# Patient Record
Sex: Female | Born: 1980 | Hispanic: Yes | Smoking: Current some day smoker
Health system: Southern US, Community
[De-identification: ages and names within clinical notes are randomized; demographics above are authoritative.]

---

## 2009-02-09 ENCOUNTER — Ambulatory Visit: Payer: Self-pay | Admitting: Family Medicine

## 2011-06-14 ENCOUNTER — Ambulatory Visit: Payer: Self-pay

## 2011-07-11 ENCOUNTER — Ambulatory Visit: Payer: Self-pay | Admitting: Internal Medicine

## 2013-05-23 ENCOUNTER — Emergency Department: Payer: Self-pay | Admitting: Emergency Medicine

## 2014-09-18 ENCOUNTER — Ambulatory Visit (INDEPENDENT_AMBULATORY_CARE_PROVIDER_SITE_OTHER): Payer: Managed Care, Other (non HMO) | Admitting: Podiatry

## 2014-09-18 ENCOUNTER — Encounter: Payer: Self-pay | Admitting: Podiatry

## 2014-09-18 VITALS — BP 106/73 | HR 73 | Resp 16 | Ht 63.0 in | Wt 180.0 lb

## 2014-09-18 DIAGNOSIS — L03012 Cellulitis of left finger: Secondary | ICD-10-CM

## 2014-09-18 DIAGNOSIS — L03039 Cellulitis of unspecified toe: Secondary | ICD-10-CM

## 2014-09-18 MED ORDER — CEPHALEXIN 500 MG PO CAPS: 500.0000 mg | ORAL_CAPSULE | Freq: Three times a day (TID) | ORAL | Status: AC

## 2014-09-18 NOTE — Patient Instructions (Signed)

## 2014-09-18 NOTE — Progress Notes (Signed)
   Subjective:    Patient ID: Jennifer Carney, female    DOB: 1981-01-12, 34 y.o.   MRN: [id number]  HPI Comments: 34 year old female presents the office today with complaints of an infected left fourth toenail. She states that his been ongoing since Saturday his been progressive. She states that wearing shoes hurt the toe. She has been soaking the foot in Epson salts and using peroxide. She's been taking ibuprofen and Advil for the pain. She denies any streaking however the toe has become red and swollen. She has noticed some common out from the corners of the toenail. She states this is previously happen on this toenail before the toenails been taken off previously. No other complaints at this time.     Review of Systems  All other systems reviewed and are negative.      Objective:   Physical Exam AAO x3, NAD DP/PT pulses palpable bilaterally, CRT less than 3 seconds Protective sensation intact with Simms Weinstein monofilament, vibratory sensation intact, Achilles tendon reflex intact On the medial aspect the left fourth digit there is purulence expressed from the corners of the toenail. There is evidence of incurvation along the medial nail border. There is erythema overlying the distal aspect of the left digit with edema. There is no ascending cellulitis. No areas of fluctuance or crepitus. The remaining nails without pathology. No open lesions or pre-ulcer lesions identified. No pain with calf compression, swelling, warmth, erythema. No areas of pinpoint bony tenderness or pain the vibratory sensation bilaterally. No overlying edema, erythema, increase in warmth to bilateral lower extremities.        Assessment & Plan:  34 year old female left medial fourth digit paronychia/ingrown toenail -Treatment options were discussed the patient including alternatives, risks, complications. -At this time recommended a partial nail avulsion of the left fourth digit nail. Risks and complications  were discussed the patient for which she understands and verbally consents to the procedure. Under sterile conditions a total of 3 mL of a one-to-one mixture of 2% lidocaine plain and 0.5% Marcaine plain was infiltrated in a digital block fashion. Once anesthetized, the skin was prepped in sterile fashion. Tourniquet was then applied. Next the medial border of the left hallux nail was then sharply excised making sure to remove the entire offending nail border. There is found to be purulence expressed during the procedure. Once the nail was removed the area was debrided and no further purulence was expressed. The area was then irrigated. Betadine ointment was applied followed by a dry sterile dressing. After application of the dressing the tourniquet was removed and there was found to be an immediate capillary refill time to the digit. The patient tolerated the procedure well without any complications. Post procedure instructions were discussed the patient for which she verbally understood. Prescribed Keflex. Monitor for any clinical signs or symptoms of worsening infection and directed to call the office immediately should any occur or go directly to the emergency room. We discussed possible chemical matricectomy the future to help prevent this from recurring and she would like to proceed with this once the infection clears.

## 2014-09-25 ENCOUNTER — Ambulatory Visit (INDEPENDENT_AMBULATORY_CARE_PROVIDER_SITE_OTHER): Payer: Managed Care, Other (non HMO) | Admitting: Podiatry

## 2014-09-25 VITALS — BP 102/67 | HR 100 | Resp 16

## 2014-09-25 DIAGNOSIS — L03012 Cellulitis of left finger: Secondary | ICD-10-CM

## 2014-09-25 NOTE — Patient Instructions (Signed)

## 2014-09-28 NOTE — Progress Notes (Signed)
Patient ID: Jennifer Carney, female   DOB: [birth year], 34 y.o.   MRN: [id number]  Subjective: 34 year old female returns the office 1 week status post left medial fourth digit partial nail avulsion secondary to paronychia. She states that she has been continuing to soak the foot twice a day Epson salts covering with antibiotic ointment and a Band-Aid. She has been taking the antibiotics. She states that periodically the toe is well and wearing certain shoes however she denies any drainage or purulence. She states that there is been no significant redness overlying the area and denies any streaking. There is currently no tenderness to palpation overlying the site. No other complaints at this time. Denies any systemic complaints as fevers, chills, nausea, vomiting.  Objective: AAO 3, NAD DP/PT pulses palpable bilaterally, CRT less than 3 seconds Protective sensation appears to be intact with Dorann OuSimms Weinstein monofilament, vibratory sensation intact. Left fourth digit status post partial nail avulsion of the medial aspect which is healing well at this time. There is a small amount of hyperkeratotic tissue overlying the area. There is no tenderness to palpation. There is trace edema. No surrounding erythema, increase in warmth, ascending cellulitis, drainage/purulence. No other areas of tenderness to bilateral lower extremity is. No overlying edema, erythema, increase in warmth. No pain with calf compression, swelling, warmth, erythema.  Assessment: 34 year old female 1 week status post left medial fourth digit partial nail avulsion secondary to paronychia, resolving.  Plan: -Treatment options were discussed with the patient including alternatives, risks, complications. -Recommended her to continue soaking in Epson salt soaks twice a day followed by antibiotic ointment and a Band-Aid. Can leave the area uncovered at night. Continue this until the area has completely healed. Months for a signs or symptoms of  infection and directed to call the office immediately should any occur or go to the ER.  -She was inquiring about using phenol to help prevent that corner from reoccurring as this is the third infection she's had a that toenail this year. Discussed with her that once she finishes this course of antibiotics and the areas healed and repeat procedure. She will call the office next couple weeks to schedule this.  -Follow-up as needed. In the meantime, course call the office with any questions, concerns, change in symptoms.

## 2015-02-05 ENCOUNTER — Ambulatory Visit: Payer: Managed Care, Other (non HMO) | Attending: Physician Assistant | Admitting: Physical Therapy

## 2015-02-05 ENCOUNTER — Encounter: Payer: Self-pay | Admitting: Physical Therapy

## 2015-02-05 DIAGNOSIS — M542 Cervicalgia: Secondary | ICD-10-CM | POA: Insufficient documentation

## 2015-02-05 DIAGNOSIS — M25511 Pain in right shoulder: Secondary | ICD-10-CM | POA: Diagnosis not present

## 2015-02-06 NOTE — Therapy (Signed)
[place] [facility] [address] [place], [address] Phone: [phone]   Fax:  [phone]  Physical Therapy Evaluation  Patient Details  Name: Jennifer Carney MRN: [id number] Date of Birth: 1981-03-21 Referring Provider:  Geoffery Lyons, MD  Encounter Date: 02/05/2015      PT End of Session - 02/05/15 2000    Visit Number 1   Number of Visits 12   Date for PT Re-Evaluation 03/20/15   PT Start Time 1835   PT Stop Time 1920   PT Time Calculation (min) 45 min   Activity Tolerance Patient tolerated treatment well   Behavior During Therapy [facility] for tasks assessed/performed      History reviewed. No pertinent past medical history.  History reviewed. No pertinent past surgical history.  There were no vitals filed for this visit.  Visit Diagnosis:  Right shoulder pain - Plan: PT plan of care cert/re-cert  Cervicalgia - Plan: PT plan of care cert/re-cert      Subjective Assessment - 02/05/15 1905    Subjective Patient reports sh is having pain in right shoulder that limits her ability to perform dialiy tasks without difficulty.   Patient Stated Goals to get rid of pain and be able to use right UE again normally without pain/difficulty   Currently in Pain? Yes   Pain Score 4    Pain Location Shoulder   Pain Orientation Right   Pain Descriptors / Indicators Aching   Pain Type Chronic pain   Pain Onset More than a month ago   Aggravating Factors  reaching back out to side, lifting, reaching, lying on right side for sleeping   Pain Relieving Factors rest   Effect of Pain on Daily Activities unable to use right UE without pain   Multiple Pain Sites No            [facility] PT Assessment - 02/05/15 2356    Assessment   Medical Diagnosis right shoulder pain/cervicalgia   Onset Date/Surgical Date 06/05/14   Hand Dominance Right   Next MD Visit unknown   Precautions   Precautions None   Balance Screen   Has the  patient fallen in the past 6 months No   Has the patient had a decrease in activity level because of a fear of falling?  No   Is the patient reluctant to leave their home because of a fear of falling?  No   Prior Function   Level of Independence Independent      Objective: AROM: cervical spine: rotation right and left: 55 degrees without reproduction of symptoms, lateral flexion right 40 degrees with pain right upper trapezius, left 50 degrees, flexion 70 degrees, extension 45 degrees without pain AROM: right shoulder limited forward elevation, abduction ER and IR with pain limiting motions Spurlings negative for reproduction of symptoms, distraction and compression cervical spine negative for reproduction of symptoms.  Strength: grossly WFL's both UE's with pain reproduced with right shoulder ER and elevation/abduction Impingement tests + for reproduction of symptoms right shoulder Observation: posture: + winging of right scapula, + hiking right shoulder  Treatment: Highvolt estim. Right shoulder/upper trapezius to decrease pain and muscle spasms: continuous mode, (4) electrodes applied to right shoulder girdle with patient seated with ice pack x 20 min. Applied to same  Patient response to treatment: decreased spasms 50% and pain to 2-3/10 allowing patient to put shirt back on with much less difficulty and pain.  PT Education - 02/05/15 1900    Education provided Yes   Education Details educated in positions to avoid pain right shoulder: no reaching out or crossing midline, or back   Person(s) Educated Patient   Methods Explanation;Demonstration   Comprehension Verbalized understanding             PT Long Term Goals - 02/05/15 2010    PT LONG TERM GOAL #1   Title Patient will improve quickDash to 45% or better demonstrating imrpoved UE function with decreased pain by 02/26/2015   Baseline 59% score   Status New   PT LONG TERM GOAL #2   Title Patient will be able  to reach back and overhead with right UE with minimal pain to improe personal care and work related activities  by 02/26/2015   Baseline unable to reach back without increased pain limiting movement   Status New   PT LONG TERM GOAL #3   Title QuickDAsh score 30% or less by 03/20/2015 indicating improved self perceived disablity with functional use right UE    Baseline 59% score   Status New   PT LONG TERM GOAL #4   Title Patient will be independent with home program for self management of pain and exercises by 03/20/2015   Baseline patient has limited knowledge of appropriate pain control/progressive exercises for right shoulder pain   Status New            Plan - 02/05/15 2000    Clinical Impression Statement Patient is a right hand dominant female who presents with painful right shoulder with limitations in ROM and strength and limited knowledge of appropriate pain control strategies and progression of exercises to return to prior level of function. Her quickDash scores are 59% and 50% (work module) impairment. She has + spasms in upper trapezius and decreased scapualr control and strength to stabilize right shoulder and will require physical therapy intervention to address pain and weakness/motor control in order to return to previous level of function with personal care and work related activties involving right UE.    Pt will benefit from skilled therapeutic intervention in order to improve on the following deficits Increased muscle spasms;Decreased range of motion;Pain;Impaired UE functional use   Rehab Potential Good   PT Frequency 2x / week   PT Duration 6 weeks   PT Treatment/Interventions Manual techniques;Cryotherapy;Electrical Stimulation;Therapeutic exercise;Moist Heat;Ultrasound;Patient/family education   PT Next Visit Plan pain control, ther. ex, manual  therapy techniques    PT Home Exercise Plan scapular control, posture correction for right shoulder/cervical spine    Consulted and Agree with Plan of Care Patient         Problem List There are no active problems to display for this patient.   Beacher MayBrooks, Mcguire Gasparyan PT 02/06/2015, 3:36 PM  [place] [facility] [address] [place], [place], [address] Phone: [phone]   Fax:  [phone]

## 2015-02-09 ENCOUNTER — Ambulatory Visit: Payer: Managed Care, Other (non HMO) | Admitting: Physical Therapy

## 2015-02-09 ENCOUNTER — Encounter: Payer: Self-pay | Admitting: Physical Therapy

## 2015-02-09 DIAGNOSIS — M25511 Pain in right shoulder: Secondary | ICD-10-CM | POA: Diagnosis not present

## 2015-02-10 NOTE — Therapy (Signed)
[place] [facility] [address] [place], [place], [address] Phone: [phone]   Fax:  [phone]  Physical Therapy Treatment  Patient Details  Name: Jennifer Carney MRN: [id number] Date of Birth: 03/29/1981 Referring Provider:  Geoffery Lyonsurner, Eric M, MD  Encounter Date: 02/09/2015      PT End of Session - 02/09/15 2040    Visit Number 2   Number of Visits 12   Date for PT Re-Evaluation 03/20/15   PT Start Time 1945   PT Stop Time 2030   PT Time Calculation (min) 45 min   Activity Tolerance Patient tolerated treatment well   Behavior During Therapy [facility] for tasks assessed/performed      History reviewed. No pertinent past medical history.  History reviewed. No pertinent past surgical history.  There were no vitals filed for this visit.  Visit Diagnosis:  Right shoulder pain      Subjective Assessment - 02/09/15 1947    Subjective Patient reports sh is having pain in right shoulder that limits her ability to perform dialiy tasks without difficulty. she felt much better following last session and is able to move arm more with less difficulty and pain.    Patient Stated Goals to get rid of pain and be able to use right UE again normally without pain/difficulty   Currently in Pain? 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 Applied (4) electrodes to right shoulder girdle for upper trapezius, lateral aspect of shoulder and along medial border of scapula with patient seated and intensity to tolerance     Exercises: instructed in isometric FF, ER and IR right shoulder at wall with verbal cues following demonstration, Re assessed home program for scapular adduction with lower trapezius activation    Patient response to treatment: reported decrease symptoms into right shoulder, less difficulty with reaching up overhead following session and improve control and performance of exercises for strengthening following demonstration and with verbal cuing.         PT Education - 02/09/15 2030    Education provided Yes   Education Details instructed in exercises for right shoulder to begins strengthening and re educating scapular stabilizers   Person(s) Educated Patient   Methods Explanation;Demonstration;Verbal cues   Comprehension Verbalized understanding;Returned demonstration;Verbal cues required             PT Long Term Goals - 02/05/15 2010    PT LONG TERM GOAL #1   Title Patient will improve quickDash to 45% or better demonstrating imrpoved UE function with decreased pain by 02/26/2015   Baseline  59% score   Status New   PT LONG TERM GOAL #2   Title Patient will be able to reach back and overhead with right UE with minimal pain to improe personal care and work related activities  by 02/26/2015   Baseline unable to reach back without increased pain limiting movement   Status New   PT LONG TERM GOAL #3   Title QuickDAsh score 30% or less by 03/20/2015 indicating improved self perceived disablity with functional use right UE    Baseline 59% score   Status New   PT LONG TERM GOAL #4   Title Patient  will be independent with home program for self management of pain and exercises by 03/20/2015   Baseline patient has limited knowledge of appropriate pain control/progressive exercises for right shoulder pain   Status New               Plan - 02/09/15 2044    Clinical Impression Statement Patient demonstrates decreased pain and improved ability to move right UE since previous session. She continues with limitations of pain, limited strength and ROM which limit her ability to perform ADL's and work related activitie requiring repetitive use of right shoulder as shoulder level. She will benefit from continued physical therapy interventi to address limitaitons and progress towards goals.    Pt will benefit from skilled therapeutic intervention in order to improve on the following deficits Increased muscle spasms;Decreased range of motion;Pain;Impaired UE functional use   Rehab Potential Good   PT Frequency 2x / week   PT Duration 6 weeks   PT Treatment/Interventions Manual techniques;Cryotherapy;Electrical Stimulation;Therapeutic exercise;Moist Heat;Ultrasound;Patient/family education   PT Next Visit Plan pain control, ther. ex, manual  therapy techniques    PT Home Exercise Plan scapular control, posture correction for right shoulder/cervical spine        Problem List There are no active problems to display for this patient.   Beacher May PT 02/10/2015, 4:19 PM  [place] [facility] [address] [place], [address] Phone: [phone]   Fax:  [phone]

## 2015-02-11 ENCOUNTER — Ambulatory Visit: Payer: Managed Care, Other (non HMO) | Admitting: Physical Therapy

## 2015-02-17 ENCOUNTER — Encounter: Payer: Self-pay | Admitting: Physical Therapy

## 2015-02-17 ENCOUNTER — Ambulatory Visit: Payer: Managed Care, Other (non HMO) | Admitting: Physical Therapy

## 2015-02-17 DIAGNOSIS — M542 Cervicalgia: Secondary | ICD-10-CM

## 2015-02-17 DIAGNOSIS — M25511 Pain in right shoulder: Secondary | ICD-10-CM

## 2015-02-17 NOTE — Therapy (Signed)
[place] [facility] [address] [place], [address] Phone: [phone]   Fax:  [phone]  Physical Therapy Treatment  Patient Details  Name: Jennifer Carney MRN: [id number] Date of Birth: 1981-05-01 Referring Provider:  Geoffery Lyons, MD  Encounter Date: 02/17/2015      PT End of Session - 02/17/15 1833    Visit Number 3   Number of Visits 12   Date for PT Re-Evaluation 03/20/15   PT Start Time 1830   PT Stop Time 1905   PT Time Calculation (min) 35 min   Activity Tolerance Patient tolerated treatment well   Behavior During Therapy [facility] for tasks assessed/performed      History reviewed. No pertinent past medical history.  History reviewed. No pertinent past surgical history.  There were no vitals filed for this visit.  Visit Diagnosis:  Right shoulder pain  Cervicalgia      Subjective Assessment - 02/17/15 1832    Subjective Patient reports she is having pain in right shoulder that limits her ability to perform daily tasks without difficulty. She continues to have increased difficulty with work related tasks involving right UE.    Patient Stated Goals to get rid of pain and be able to use right UE again normally without pain/difficulty   Currently in Pain? Yes   Pain Score 4    Pain Location Shoulder   Pain Orientation Right   Pain Descriptors / Indicators Aching   Pain Type Chronic pain   Pain Onset More than a month ago   Multiple Pain Sites No       Objective: Right shoulder AROM: forward elevation WFL's with pain end range, ER and IR WFL's with pain Palpation: + tenderness along upper trapezius with spasms and pain into upper arm        [facility] Adult PT Treatment/Exercise - 02/17/15 0001    Modalities   Modalities Electrical Stimulation;Cryotherapy   Cryotherapy   Number Minutes Cryotherapy 20 Minutes   Cryotherapy Location Shoulder   Type of Cryotherapy Ice pack   Electrical  Stimulation   Electrical Stimulation Location right shoulder    Electrical Stimulation Parameters high volt (2) electrodes applied to anterior and upper trapezius muscle, Russian stim. with electrodes placed along medial aspect of scapula 10/10 cycle    Electrical Stimulation Goals Pain;Neuromuscular facilitation   Manual Therapy   Manual Therapy Soft tissue mobilization   Manual therapy comments with patient sitting in chair    Soft tissue mobilization right shoulder upper trapezius and anterior aspect of shoulder into upper arm     Patient response to treatment:  Decreased pain in right shoulder with improved ability to raise arm overhead with less pain and difficulty, no exercise performed due to increased pain level with movement            PT Education - 02/17/15 1850    Education provided Yes   Education Details Instructed to use ice every hour as needed to control pain and spasms in right shoulder   Person(s) Educated Patient   Methods Explanation   Comprehension Verbalized understanding             PT Long Term Goals - 02/05/15 2010    PT LONG TERM GOAL #1   Title Patient will improve quickDash to 45% or better demonstrating imrpoved UE function with decreased pain by 02/26/2015   Baseline 59% score   Status New   PT LONG TERM GOAL #2  Title Patient will be able to reach back and overhead with right UE with minimal pain to improe personal care and work related activities  by 02/26/2015   Baseline unable to reach back without increased pain limiting movement   Status New   PT LONG TERM GOAL #3   Title QuickDAsh score 30% or less by 03/20/2015 indicating improved self perceived disablity with functional use right UE    Baseline 59% score   Status New   PT LONG TERM GOAL #4   Title Patient will be independent with home program for self management of pain and exercises by 03/20/2015   Baseline patient has limited knowledge of appropriate pain control/progressive  exercises for right shoulder pain   Status New               Plan - 02/17/15 1833    Clinical Impression Statement Patient reported decreased pain in right shoulder with treatment. She conitnues with pain as primary limiting factor to being able to use right UE without difficulty for work and [place] tasks.    Pt will benefit from skilled therapeutic intervention in order to improve on the following deficits Increased muscle spasms;Decreased range of motion;Pain;Impaired UE functional use   Rehab Potential Good   PT Frequency 2x / week   PT Duration 6 weeks   PT Treatment/Interventions Manual techniques;Cryotherapy;Electrical Stimulation;Therapeutic exercise;Moist Heat;Ultrasound;Patient/family education   PT Next Visit Plan pain control, ther. ex, manual  therapy techniques    PT Home Exercise Plan scapular control, posture correction for right shoulder/cervical spine        Problem List There are no active problems to display for this patient.   Beacher May PT 02/17/2015, 11:20 PM  [place] [facility] SP[facility]TS [facility] [address], [place], [address] Phone: [phone]   Fax:  [phone]

## 2015-02-19 ENCOUNTER — Ambulatory Visit: Payer: Managed Care, Other (non HMO) | Admitting: Physical Therapy

## 2015-02-19 ENCOUNTER — Encounter: Payer: Self-pay | Admitting: Physical Therapy

## 2015-02-19 DIAGNOSIS — M25511 Pain in right shoulder: Secondary | ICD-10-CM | POA: Diagnosis not present

## 2015-02-19 DIAGNOSIS — M542 Cervicalgia: Secondary | ICD-10-CM

## 2015-02-20 NOTE — Therapy (Signed)
[facility]INE [address] [place], [address] Phone: [phone]   Fax:  [phone]  Physical Therapy Treatment  Patient Details  Name: Jennifer Carney MRN: [id number] Date of Birth: [birth year] Referring Provider:  Geoffery Lyons, MD  Encounter Date: 02/19/2015      PT End of Session - 02/19/15 1930    Visit Number 4   Number of Visits 12   Date for PT Re-Evaluation 03/20/15   PT Start Time 1830   PT Stop Time 1915   PT Time Calculation (min) 45 min   Activity Tolerance Patient tolerated treatment well   Behavior During Therapy [facility] for tasks assessed/performed      History reviewed. No pertinent past medical history.  History reviewed. No pertinent past surgical history.  There were no vitals filed for this visit.  Visit Diagnosis:  Right shoulder pain  Cervicalgia      Subjective Assessment - 02/19/15 1840    Subjective Paitent reports her right shoulder is still sore. She is having headache today.   Patient Stated Goals to get rid of pain and be able to use right UE again normally without pain/difficulty   Currently in Pain? Yes   Pain Score 4    Pain Location Shoulder   Pain Orientation Right   Pain Descriptors / Indicators Aching   Pain Type Chronic pain   Pain Onset More than a month ago   Multiple Pain Sites No          [facility] Adult PT Treatment/Exercise - 02/19/15 1842    Modalities   Modalities Electrical Stimulation;Cryotherapy   Cryotherapy   Cryotherapy Location Shoulder right: ice pack   Electrical Stimulation   Electrical Stimulation Location/parameters right shoulder: high volt estim. Applied to latera aspect of shoulder and upper trapezius, continuous mode with intensity to tolerance, [place] stim. Applied along medial border of right scapula and lower trapezius muscle with intensity to tolerance/contraction 10/10 cycle with patient seated in chair   Electrical Stimulation  Goals Pain;Neuromuscular facilitation   Ultrasound   location Right shoulder/upper trapezius into cervical spine paraspinal muscles   parameters pulsed @ 50% 1.4w/cm2 x 10 min. With patient seated followed by electrical stimulation          Patient response to treatment: decreased spasms and pain and decreased tenderness to palpation following US/estim. Patient was able to raise right UE out to side and overhead with less stiffness and difficulty following treatment, she verbalized understanding of home exercises to perform tomorrow when in less pain.          PT Education - 02/19/15 1915    Education provided Yes   Education Details Reassessed home exercises to continue for ROM and scapular control   Person(s) Educated Patient   Methods Explanation   Comprehension Verbalized understanding             PT Long Term Goals - 02/05/15 2010    PT LONG TERM GOAL #1   Title Patient will improve quickDash to 45% or better demonstrating imrpoved UE function with decreased pain by 02/26/2015   Baseline 59% score   Status New   PT LONG TERM GOAL #2   Title Patient will be able to reach back and overhead with right UE with minimal pain to improe personal care and work related activities  by 02/26/2015   Baseline unable to reach back without increased pain limiting movement   Status New   PT  LONG TERM GOAL #3   Title QuickDAsh score 30% or less by 03/20/2015 indicating improved self perceived disablity with functional use right UE    Baseline 59% score   Status New   PT LONG TERM GOAL #4   Title Patient will be independent with home program for self management of pain and exercises by 03/20/2015   Baseline patient has limited knowledge of appropriate pain control/progressive exercises for right shoulder pain   Status New               Plan - 02/19/15 1930    Clinical Impression Statement Patient demonstrates improving ROM and decreased pain in right shoulder. She  continues with headache which she is concerned about and will follow up with MD regarding this (history of migraines).    Pt will benefit from skilled therapeutic intervention in order to improve on the following deficits Increased muscle spasms;Decreased range of motion;Pain;Impaired UE functional use   Rehab Potential Good   PT Frequency 2x / week   PT Duration 6 weeks   PT Treatment/Interventions Manual techniques;Cryotherapy;Electrical Stimulation;Therapeutic exercise;Moist Heat;Ultrasound;Patient/family education   PT Next Visit Plan pain control, ther. ex, manual  therapy techniques    PT Home Exercise Plan scapular control, posture correction for right shoulder/cervical spine        Problem List There are no active problems to display for this patient.   Beacher May PT 02/20/2015, 12:17 PM  [facility] PHYSICAL AND SPORTS MEDICINE [address], [place], [address] Phone: [phone]   Fax:  [phone]

## 2015-02-24 ENCOUNTER — Encounter: Payer: Self-pay | Admitting: Physical Therapy

## 2015-02-24 ENCOUNTER — Ambulatory Visit: Payer: Managed Care, Other (non HMO) | Admitting: Physical Therapy

## 2015-02-24 DIAGNOSIS — M25511 Pain in right shoulder: Secondary | ICD-10-CM | POA: Diagnosis not present

## 2015-02-24 NOTE — Therapy (Signed)
[facility] PHYSICAL [facility]ORTS MEDICINE [address], [place], [address] Phone: [phone]   Fax:  [phone]  Physical Therapy Treatment  Patient Details  Name: Jennifer Carney MRN: [id number] Date of Birth: [birth year] Referring Provider:  Geoffery Lyons, MD  Encounter Date: 02/24/2015      PT End of Session - 02/24/15 1950    Visit Number 5   Number of Visits 12   Date for PT Re-Evaluation 03/20/15   PT Start Time 1832   PT Stop Time 1925   PT Time Calculation (min) 53 min   Activity Tolerance Patient tolerated treatment well   Behavior During Therapy [facility] for tasks assessed/performed      History reviewed. No pertinent past medical history.  History reviewed. No pertinent past surgical history.  There were no vitals filed for this visit.  Visit Diagnosis:  Right shoulder pain      Subjective Assessment - 02/24/15 1833    Subjective Paitent reports her right shoulder is still sore. Overall much better and is working decals at work and this seems to take the pressure off of her neck and shoulder.    Patient Stated Goals to get rid of pain and be able to use right UE again normally without pain/difficulty   Currently in Pain? Yes   Pain Score 3    Pain Location Shoulder   Pain Orientation Right   Pain Descriptors / Indicators Aching   Pain Type Chronic pain   Pain Onset More than a month ago   Multiple Pain Sites No          [facility] Adult PT Treatment/Exercise - 02/24/15 2028    Exercises   Exercises Other Exercises   Other Exercises  at [employer] scapular rows 10# double, 5# single arm rows x 10 each with verbal cuing and tactile cues to prevent hiking of shoulder and keeping shoulder in good alignment, seated reverse chin up 15# x 10 reps, standing straight arm pull downs 15# with verbal cues for good alignment and not to hike shoulder   Modalities   Modalities Electrical Stimulation;Cryotherapy;Ultrasound   Cryotherapy    Number Minutes Cryotherapy 20 Minutes   Cryotherapy Location Shoulder   Electrical Stimulation   Electrical Stimulation Location right shoulder    Electrical Stimulation Parameters high volt (2) electrodes applied to anterior and uppper trapezius muscle, Russian stim. sith electrodes placed along medial aspect of scpaula 10/10 cycle   Electrical Stimulation Goals Pain;Neuromuscular facilitation   Ultrasound   Ultrasound Location Right shoulder/upper trapezius muscle with patient seated   Ultrasound Parameters pulsed 50% @ 1.4 w/cm2    Ultrasound Goals Pain   Patient response to treatment: improved control with decreased shoulder hiking and more erect posture with decreased rounded right shoulder, required verbal cuing and demonstration to perform exercises correctly, decreased spasms and pain to mild reported at end of session with pain control modalities           PT Education - 02/24/15 1900    Education provided Yes   Education Details instructed in exercises for scapular control and correct alignment of shoulder during exercises to avoid increased shoulder pain   Person(s) Educated Patient   Methods Explanation;Demonstration;Verbal cues   Comprehension Verbalized understanding;Returned demonstration;Verbal cues required             PT Long Term Goals - 02/05/15 2010    PT LONG TERM GOAL #1   Title Patient will improve quickDash to 45%  or better demonstrating imrpoved UE function with decreased pain by 02/26/2015   Baseline 59% score   Status New   PT LONG TERM GOAL #2   Title Patient will be able to reach back and overhead with right UE with minimal pain to improe personal care and work related activities  by 02/26/2015   Baseline unable to reach back without increased pain limiting movement   Status New   PT LONG TERM GOAL #3   Title QuickDAsh score 30% or less by 03/20/2015 indicating improved self perceived disablity with functional use right UE    Baseline 59%  score   Status New   PT LONG TERM GOAL #4   Title Patient will be independent with home program for self management of pain and exercises by 03/20/2015   Baseline patient has limited knowledge of appropriate pain control/progressive exercises for right shoulder pain   Status New               Plan - 02/24/15 2034    Clinical Impression Statement Patient is progressing well with decreased pain and progressing with scapular control exercises. She requires verbal cuing and guidance for correct technique during exercises and is responding well to pain control modalities.    Pt will benefit from skilled therapeutic intervention in order to improve on the following deficits Increased muscle spasms;Decreased range of motion;Pain;Impaired UE functional use   Rehab Potential Good   PT Frequency 2x / week   PT Duration 6 weeks   PT Treatment/Interventions Manual techniques;Cryotherapy;Electrical Stimulation;Therapeutic exercise;Moist Heat;Ultrasound;Patient/family education   PT Next Visit Plan pain control, ther. ex, manual  therapy techniques         Problem List There are no active problems to display for this patient.   Beacher May PT 02/24/2015, 8:37 PM  [facility] PHYSICAL AND SPORTS MEDICINE [address] [place], [address] Phone: [phone]   Fax:  [phone]

## 2015-02-26 ENCOUNTER — Ambulatory Visit: Payer: Managed Care, Other (non HMO) | Admitting: Physical Therapy

## 2015-02-26 ENCOUNTER — Encounter: Payer: Self-pay | Admitting: Physical Therapy

## 2015-02-26 DIAGNOSIS — M542 Cervicalgia: Secondary | ICD-10-CM

## 2015-02-26 DIAGNOSIS — M25511 Pain in right shoulder: Secondary | ICD-10-CM | POA: Diagnosis not present

## 2015-02-26 NOTE — Therapy (Signed)
[facility] PHYSICAL [facility] SPORTS MEDICINE [address], [place], [address] Phone: [phone]   Fax:  [phone]  Physical Therapy Treatment  Patient Details  Name: Jennifer Carney MRN: [id number] Date of Birth: [birth year] Referring Provider:  Geoffery Lyons, MD  Encounter Date: 02/26/2015      PT End of Session - 02/26/15 1908    Visit Number 6   Number of Visits 12   Date for PT Re-Evaluation 03/20/15   PT Start Time 1835   PT Stop Time 1920   PT Time Calculation (min) 45 min   Activity Tolerance Patient tolerated treatment well   Behavior During Therapy [facility] for tasks assessed/performed      History reviewed. No pertinent past medical history.  History reviewed. No pertinent past surgical history.  There were no vitals filed for this visit.  Visit Diagnosis:  Right shoulder pain  Cervicalgia      Subjective Assessment - 02/26/15 1837    Subjective Paitent reports her right shoulder is still sore. Overall much better and does not have the intensity of headache that she did when she began physical therapy. She feels the [place] has helped with this.    Currently in Pain? Yes   Pain Score 2    Pain Location Shoulder   Pain Orientation Right   Pain Descriptors / Indicators Aching   Pain Type Chronic pain   Multiple Pain Sites No           [facility] Adult PT Treatment/Exercise - 02/26/15 1839               Modalities   Modalities Electrical Stimulation;Cryotherapy;Ultrasound   Cryotherapy   Number Minutes Cryotherapy 25 Minutes   Cryotherapy Location Shoulder, right   Type of Cryotherapy Ice pack   Electrical Stimulation   Electrical Stimulation Location right shoulder    Electrical Stimulation Parameters high volt (2) electrodes applied to upper trapezius and lateral aspect of right shoulder, Russian stim. applied alond medial apsec tof right scpula and lower tapezius muscles 10/10 cycle with patient seated   Electrical  Stimulation Goals Pain;Neuromuscular facilitation   Ultrasound   Ultrasound Location shoulder, right   Ultrasound Parameters pulse 50% @ 1.4 w/cm2    Ultrasound Goals Pain      Patient response to treatment: decreased spasms and tenderness to palpation along right upper trapezius and shoulder, able to raise right arm above shoulder with minimal difficulty and pain          PT Education - 02/26/15 1907    Education Details Re assessed home program for strengthening patient is to perform over the weekend prior to returning next week   Person(s) Educated Patient   Methods Explanation   Comprehension Verbalized understanding             PT Long Term Goals - 02/05/15 2010    PT LONG TERM GOAL #1   Title Patient will improve quickDash to 45% or better demonstrating imrpoved UE function with decreased pain by 02/26/2015   Baseline 59% score   Status New   PT LONG TERM GOAL #2   Title Patient will be able to reach back and overhead with right UE with minimal pain to improe personal care and work related activities  by 02/26/2015   Baseline unable to reach back without increased pain limiting movement   Status New   PT LONG TERM GOAL #3   Title QuickDAsh score 30% or less by 03/20/2015  indicating improved self perceived disablity with functional use right UE    Baseline 59% score   Status New   PT LONG TERM GOAL #4   Title Patient will be independent with home program for self management of pain and exercises by 03/20/2015   Baseline patient has limited knowledge of appropriate pain control/progressive exercises for right shoulder pain   Status New               Plan - 02/26/15 1908    Clinical Impression Statement Patient is progressing well with decreased pain in right shoulder and able to perform household chores and personal care with less pain and difficulty. She continues with pain with raising arm above shoulder level.    Rehab Potential Good   PT Frequency  2x / week   PT Duration 6 weeks   PT Next Visit Plan pain control, ther. ex, manual  therapy techniques    PT Home Exercise Plan scapular control, posture correction for right shoulder/cervical spine        Problem List There are no active problems to display for this patient.   Beacher May PT 02/26/2015, 10:05 PM  [facility] [facility] PHYSICAL AND SPORTS MEDICINE [address] [place], [address] Phone: [phone]   Fax:  [phone]

## 2015-03-02 ENCOUNTER — Encounter: Payer: Managed Care, Other (non HMO) | Admitting: Physical Therapy

## 2015-03-03 ENCOUNTER — Encounter: Payer: Self-pay | Admitting: Physical Therapy

## 2015-03-03 ENCOUNTER — Ambulatory Visit: Payer: Managed Care, Other (non HMO) | Admitting: Physical Therapy

## 2015-03-03 DIAGNOSIS — M25511 Pain in right shoulder: Secondary | ICD-10-CM

## 2015-03-03 DIAGNOSIS — M542 Cervicalgia: Secondary | ICD-10-CM

## 2015-03-03 NOTE — Therapy (Signed)
[facility] [address] [place], [place], [address] Phone: [phone]   Fax:  [phone]  Physical Therapy Treatment  Patient Details  Name: Jennifer Carney MRN: [id number] Date of Birth: April 28, 1981 Referring Provider:  Geoffery Lyonsurner, Eric M, MD  Encounter Date: 03/03/2015      PT End of Session - 03/03/15 1920    Visit Number 7   Number of Visits 12   Date for PT Re-Evaluation 03/20/15   PT Start Time 1833   PT Stop Time 1925   PT Time Calculation (min) 52 min   Activity Tolerance Patient tolerated treatment well   Behavior During Therapy [facility] for tasks assessed/performed      History reviewed. No pertinent past medical history.  History reviewed. No pertinent past surgical history.  There were no vitals filed for this visit.  Visit Diagnosis:  Right shoulder pain  Cervicalgia      Subjective Assessment - 03/03/15 1834    Subjective Patient reports her shoulder is improving and she is stiff and sore in her neck today. 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 She feels therapy is helping with pain and improved ability to move right shoulder   Currently in Pain? Yes   Pain Score 2    Pain Location Neck   Pain Orientation Right   Pain Descriptors / Indicators Aching   Pain Type Chronic pain   Pain Onset More than a month ago   Multiple Pain Sites No      Palpation: moderate spasms with tenderness palpable along right upper trapezius into right cervical spine paraspinal muscles       [facility] Adult PT Treatment/Exercise - 03/03/15 1917    Exercises   Exercises Other Exercises   Other Exercises  at [employer] scapular rows 10# double x 10  with verbal cuing and tactile cues to prevent hiking of shoulder and keeping shoulder in good alignment, seated reverse chin up 15# x 10 reps, standing straight arm pull downs 15# with verbal cues for good alignment and not to hike shoulder,  chest press with 10# x 10 reps with assistance to monitor weight intensity   Modalities   Modalities Electrical Stimulation;Cryotherapy;Ultrasound   Cryotherapy   Number Minutes Cryotherapy 20 Minutes   Cryotherapy Location Shoulder   Type of Cryotherapy Ice pack   Electrical Stimulation   Electrical Stimulation Location right shoulder    Electrical Stimulation Parameters high volt (2) electrodes applied to lateral aspect of shoulder and upper trapezius/lower cspine, [place]ussian stim. with electrodes placed along medial aspect of scapula 10/10 cycle   Electrical Stimulation Goals Pain;Neuromuscular facilitation   Ultrasound   Ultrasound Location rigth shoulder   Ultrasound Parameters 1MHz pulsed 50% @ 1.4 w/cm2 upper trpaezius and shoulder region x 10 min.   Ultrasound Goals Pain      Patient response to treatment: significant decrease in spasm in right upper trapezius muscle and along cervical spine with mild to no spasms at end of treatment, improved strength noted with exercises with mild discomfort in right shoulder with chest press and shoulder rows, required assistance and guidance with verbal cues for correct alignment/posture during treatment/exercise          PT Education - 03/03/15 1917    Education provided Yes             PT Long Term Goals - 02/05/15 2010    PT LONG TERM GOAL #1  Title Patient will improve quickDash to 45% or better demonstrating imrpoved UE function with decreased pain by 02/26/2015   Baseline 59% score   Status New   PT LONG TERM GOAL #2   Title Patient will be able to reach back and overhead with right UE with minimal pain to improe personal care and work related activities  by 02/26/2015   Baseline unable to reach back without increased pain limiting movement   Status New   PT LONG TERM GOAL #3   Title QuickDAsh score 30% or less by 03/20/2015 indicating improved self perceived disablity with functional use right UE    Baseline 59% score    Status New   PT LONG TERM GOAL #4   Title Patient will be independent with home program for self management of pain and exercises by 03/20/2015   Baseline patient has limited knowledge of appropriate pain control/progressive exercises for right shoulder pain   Status New               Plan - 03/03/15 1930    Clinical Impression Statement Patient is improving with decreasing right shoulder pain and progressing with exercises. She continues with pain in right sdie of cervical spine with spasms that requrie additional physical therapy intervention to achieve return to prior level of funciton.    Pt will benefit from skilled therapeutic intervention in order to improve on the following deficits Increased muscle spasms;Decreased range of motion;Pain;Impaired UE functional use   Rehab Potential Good   PT Frequency 2x / week   PT Duration 6 weeks   PT Treatment/Interventions Manual techniques;Cryotherapy;Electrical Stimulation;Therapeutic exercise;Moist Heat;Ultrasound;Patient/family education        Problem List There are no active problems to display for this patient.   Beacher May PT 03/03/2015, 10:51 PM  [place] [facility] PHYSICAL AND SPORTS MEDICINE [address], [place], [address] Phone: [phone]   Fax:  [phone]

## 2015-03-05 ENCOUNTER — Ambulatory Visit: Payer: Managed Care, Other (non HMO) | Admitting: Physical Therapy

## 2015-03-05 ENCOUNTER — Encounter: Payer: Self-pay | Admitting: Physical Therapy

## 2015-03-05 DIAGNOSIS — M542 Cervicalgia: Secondary | ICD-10-CM

## 2015-03-05 DIAGNOSIS — M25511 Pain in right shoulder: Secondary | ICD-10-CM | POA: Diagnosis not present

## 2015-03-05 NOTE — Therapy (Signed)
[facility] [facility] PH[facility] AND [facility] MEDICINE [address] [place], [place], [address] Phone: [phone]   Fax:  [phone]  Physical Therapy Treatment  Patient Details  Name: Jennifer Carney MRN: [id number] Date of Birth: September 16, 1980 Referring Provider:  Geoffery Lyonsurner, Eric M, [place]  Encounter Date: 03/05/2015      PT End of Session - 03/05/15 1950    Visit Number 8   Number of Visits 12   Date for PT Re-Evaluation 03/20/15   PT Start Time 1845   PT Stop Time 1925   PT Time Calculation (min) 40 min   Activity Tolerance Patient tolerated treatment well   Behavior During Therapy [facility] for tasks assessed/performed      History reviewed. No pertinent past medical history.  History reviewed. No pertinent past surgical history.  There were no vitals filed for this visit.  Visit Diagnosis:  Right shoulder pain  Cervicalgia      Subjective Assessment - 03/05/15 1930    Subjective Patient reports her shoulder is improving and she is stiff and sore in her neck today. 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 She is able to move right arm better with less pain and difficiulty.    Patient Stated Goals to get rid of pain and be able to use right UE again normally without pain/difficulty   Currently in Pain? Yes   Pain Score 2    Pain Location Neck   Pain Orientation Right   Pain Descriptors / Indicators Aching   Pain Type Chronic pain   Pain Onset More than a month ago   Multiple Pain Sites No      Objective: AROM cervical spine: lateral flexion left 40, right 45/50 degree, right shoulder WNL's without reports of increased pain   Palpation: + tenderness and spasms along right upper trapezius and cervical spine paraspinal muscles       [facility] Adult PT Treatment/Exercise - 03/05/15 1930               Modalities   Modalities Electrical Stimulation;Cryotherapy;Ultrasound   Moist Heat Therapy   Number Minutes Moist  Heat 20 Minutes   Moist Heat Location Shoulder   Cryotherapy   Cryotherapy Location --   Electrical Stimulation   Electrical Stimulation Location right shoulder    Electrical Stimulation Parameters high volt (2) electrodes applied to anterior and upper trapezius muscles, Russian stim.. with electrodes placed along medial aspect of scapula 10/10 cycle   Electrical Stimulation Goals Pain;Neuromuscular facilitation   Ultrasound   Ultrasound Location right shoulder   Ultrasound Parameters 1MHz pulsed 50% @ 1.4 w/cm2 x 10 min.   Ultrasound Goals Pain      Patient response to treatment: patient reported decreased pain in right shoulder with improved soft tissue elasticity in upper trapezius muscle and lateral cervical spine muscles, continues with decreased lateral flexion to left          PT Education - 03/05/15 1940    Education provided Yes   Education Details Reinforced home program for strengthening exercises   Person(s) Educated Patient   Methods Explanation   Comprehension Verbalized understanding             PT Long Term Goals - 02/05/15 2010    PT LONG TERM GOAL #1   Title Patient will improve quickDash to 45% or better demonstrating imrpoved UE function with decreased pain by 02/26/2015   Baseline 59% score   Status  New   PT LONG TERM GOAL #2   Title Patient will be able to reach back and overhead with right UE with minimal pain to improe personal care and work related activities  by 02/26/2015   Baseline unable to reach back without increased pain limiting movement   Status New   PT LONG TERM GOAL #3   Title QuickDAsh score 30% or less by 03/20/2015 indicating improved self perceived disablity with functional use right UE    Baseline 59% score   Status New   PT LONG TERM GOAL #4   Title Patient will be independent with home program for self management of pain and exercises by 03/20/2015   Baseline patient has limited knowledge of appropriate pain  control/progressive exercises for right shoulder pain   Status New               Plan - 03/05/15 2000    Clinical Impression Statement Patient continues to progress with goals with decreased right shoulder pain and decreased headaches. She continues with limited lateral flexion of cervical spine to left side with pulling on right. She will benefit from continued physical therapy intervention to address limitations and achieve goals.   Pt will benefit from skilled therapeutic intervention in order to improve on the following deficits Increased muscle spasms;Decreased range of motion;Pain;Impaired UE functional use   Rehab Potential Good   PT Frequency 2x / week   PT Duration 6 weeks   PT Treatment/Interventions Manual techniques;Cryotherapy;Electrical Stimulation;Therapeutic exercise;Moist Heat;Ultrasound;Patient/family education   PT Next Visit Plan pain control, ther. ex, manual  therapy techniques         Problem List There are no active problems to display for this patient.   Beacher May PT 03/05/2015, 10:32 PM  [place] [facility] PHYSICAL AND SPORTS MEDICINE [address], [place], [address] Phone: [phone]   Fax:  [phone]

## 2015-03-10 ENCOUNTER — Encounter: Payer: Self-pay | Admitting: Physical Therapy

## 2015-03-10 ENCOUNTER — Ambulatory Visit: Payer: Managed Care, Other (non HMO) | Attending: Physician Assistant | Admitting: Physical Therapy

## 2015-03-10 DIAGNOSIS — M6281 Muscle weakness (generalized): Secondary | ICD-10-CM | POA: Diagnosis present

## 2015-03-10 DIAGNOSIS — M25511 Pain in right shoulder: Secondary | ICD-10-CM | POA: Diagnosis present

## 2015-03-10 DIAGNOSIS — M542 Cervicalgia: Secondary | ICD-10-CM | POA: Diagnosis present

## 2015-03-11 NOTE — Therapy (Signed)
[place] [facility] [address] [place], [place], [address] Phone: [phone]   Fax:  [phone]  Physical Therapy Treatment  Patient Details  Name: Jennifer Carney MRN: [id number] Date of Birth: 12-05-1980 Referring Provider:  Geoffery Lyonsurner, Eric M, [place]  Encounter Date: 03/10/2015      PT End of Session - 03/10/15 1920    Visit Number 9   Number of Visits 12   Date for PT Re-Evaluation 03/20/15   PT Start Time 1800   PT Stop Time 1900   PT Time Calculation (min) 60 min   Activity Tolerance Patient tolerated treatment well   Behavior During Therapy [facility] for tasks assessed/performed      History reviewed. No pertinent past medical history.  History reviewed. No pertinent past surgical history.  There were no vitals filed for this visit.  Visit Diagnosis:  Right shoulder pain  Cervicalgia      Subjective Assessment - 03/10/15 1803    Subjective Patient reports she is improving and has less pain in right shoulder this week due to having off from work. She is having soreness in shoulder in general and still cannot use it like she used to without pain and difficulty   Patient Stated Goals to get rid of pain and be able to use right UE again normally without pain/difficulty   Currently in Pain? Yes   Pain Score 2    Pain Orientation Right   Pain Onset More than a month ago   Pain Frequency Intermittent   Multiple Pain Sites No           [facility] Adult PT Treatment/Exercise - 03/10/15 2250    Exercises   Exercises  Other Exercises   Other Exercises   with [facility] at [facility]: scapular rows with 10# x 15 reps, with verbal and tactile cues to prevent hiking of right shoulder, seated reverse chin up with 15# 2 x 10 reps, standing straight arm pull downs with 15# x 15 reps    Modalities   Modalities Electrical stimulation;Cryotherapy;Ultrasound   Cryotherapy   Ice pack Location  Shoulder, right   Electrical  Stimulation   Electrical Stimulation Location/parameters Right shoulder: high volt estim. Continuous for muscle spams (2) electrodes applied to anterior and upper trapezius, Guern[place]ian stime 10/10 cycle applied along right medial border of scapula with intensity to contraction   Electrical Stimulation Goals Pain;neuromuscular facilitation   Ultrasound   Ultrasound parameters/ Goals  1 MHz pulsed @ 50% 1.4 w/cm2 x 10 min. To right shoulder/upper trapezius muscle with patient sitting in chair Pain      Patient response to treatment: improved controlled motion of right shoulder during exercises following instruction and verbal cues for correct alignment of right shoulder and with repetition, decreased pain reported in right shoulder with US and estim., mild to no pain at end of session          PT Education - 03/10/15 1845    Education provided Yes   Education Details Reinforced home exercises for improving strength right shoulder/scapular control   Person(s) Educated Patient   Methods Explanation   Comprehension Verbalized understanding             PT Long Term Goals - 02/05/15 2010    PT LONG TERM GOAL #1   Title Patient will improve quickDash to 45% or better demonstrating imrpoved UE function with decreased pain by 02/26/2015   Baseline 59% score   Status New  PT LONG TERM GOAL #2   Title Patient will be able to reach back and overhead with right UE with minimal pain to improe personal care and work related activities  by 02/26/2015   Baseline unable to reach back without increased pain limiting movement   Status New   PT LONG TERM GOAL #3   Title QuickDAsh score 30% or less by 03/20/2015 indicating improved self perceived disablity with functional use right UE    Baseline 59% score   Status New   PT LONG TERM GOAL #4   Title Patient will be independent with home program for self management of pain and exercises by 03/20/2015   Baseline patient has limited knowledge of  appropriate pain control/progressive exercises for right shoulder pain   Status New               Plan - 03/10/15 1848    Clinical Impression Statement Patient is progressing well towards goals with decreased shoulder pain and improving functional use with work related activities and with dressing and personal care tasks. She continues with weakness and pain that limit full function and will continue to benefit from additional physical therapy intervention to achieve return to prior level of function.    Pt will benefit from skilled therapeutic intervention in order to improve on the following deficits Increased muscle spasms;Decreased range of motion;Pain;Impaired UE functional use   Rehab Potential Good   PT Frequency 2x / week   PT Duration 6 weeks   PT Treatment/Interventions Manual techniques;Cryotherapy;Electrical Stimulation;Therapeutic exercise;Moist Heat;Ultrasound;Patient/family education   PT Next Visit Plan pain control, ther. ex, manual  therapy techniques         Problem List There are no active problems to display for this patient.   Beacher May PT 03/11/2015, 7:00 PM  [facility] [address], [place], [address] Phone: [phone]   Fax:  [phone]

## 2015-03-12 ENCOUNTER — Encounter: Payer: Self-pay | Admitting: Physical Therapy

## 2015-03-12 ENCOUNTER — Ambulatory Visit: Payer: Managed Care, Other (non HMO) | Admitting: Physical Therapy

## 2015-03-12 DIAGNOSIS — M542 Cervicalgia: Secondary | ICD-10-CM

## 2015-03-12 DIAGNOSIS — M25511 Pain in right shoulder: Secondary | ICD-10-CM

## 2015-03-12 NOTE — Therapy (Signed)
[place] [facility] [address] [place], [place], [address] Phone: [phone]   Fax:  [phone]  Physical Therapy Treatment  Patient Details  Name: Jennifer Carney MRN: [id number] Date of Birth: 28-Jan-1981 Referring Provider:  Geoffery Lyonsurner, Eric M, [place]  Encounter Date: 03/12/2015      PT End of Session - 03/12/15 0905    Visit Number 10   Number of Visits 12   Date for PT Re-Evaluation 03/20/15   PT Start Time 0808   PT Stop Time 0900   PT Time Calculation (min) 52 min   Activity Tolerance Patient tolerated treatment well   Behavior During Therapy [facility] for tasks assessed/performed      History reviewed. No pertinent past medical history.  History reviewed. No pertinent past surgical history.  There were no vitals filed for this visit.  Visit Diagnosis:  Right shoulder pain  Cervicalgia      Subjective Assessment - 03/12/15 0849    Subjective Patient reports she is improving and has less pain in right shoulder this week due to having off from work. She is returning to work Advertising account executivetomorrow. Main complaint is right sided pain and spasms lower cervical spine/upper trapezius.   Patient Stated Goals to get rid of pain and be able to use right UE again normally without pain/difficulty   Currently in Pain? Yes   Pain Score 1    Pain Location Shoulder   Pain Orientation Right   Pain Descriptors / Indicators Aching;Spasm   Pain Type Chronic pain   Pain Onset More than a month ago   Pain Frequency Intermittent      Objective: Palpation: right side upper trapezius with spasm lower cervical spine region, right cervical spine paraspinal muscles AROM: right shoulder forward elevation WNL's with increased discomfort at end range       [facility] - 03/12/15 0851    Exercises   Exercises Other Exercises   Other Exercises  instructed in additional exercises: thoracic mobilization/stretch with towel, standing  scapular rows and shoulder extension to hips x 10 rpes each with verbal cues and demonstration, side lying ER with 4# weight, supine hook lying with 4# weight bilateral forward flexion overhead, instructed in self stretching of upper trapezius. Single arm row in standing with 4# weight with verbal cuing x 10 reps each.   Modalities   Modalities Electrical Stimulation;Cryotherapy;Ultrasound   Moist Heat Therapy   Moist Heat Location --   Cryotherapy   Number Minutes Cryotherapy 20 Minutes   Cryotherapy Location Shoulder   Type of Cryotherapy Ice pack   Electrical Stimulation   Electrical Stimulation Location right shoulder    Electrical Stimulation Parameters high volt (2) electrodes applied to right shoulder/upper trapezius muscle, russian stim with electrodes place along medial aspect of scapula 10/10 cycle   Electrical Stimulation Goals Pain;Neuromuscular facilitation   Ultrasound   Ultrasound Location right shoulder   Ultrasound Parameters 3MHz static pulsed 20% 0.8 w/cm2 x 3 min. over trigger point right upper trapezius    Ultrasound Goals Pain   Manual Therapy   Manual Therapy Soft tissue mobilization;Myofascial release   Soft tissue mobilization upper trapezius, cervical spine,   Myofascial Release upper trapezius release 2 x 60 seconds, sub occipital release with patient supine lying      Patient response to treatment: improved flexibility in right upper trapezius and ROM cervical spine with decreased stiffness and tension reported, good technique with exercises following instruction, demonstration and with verbal  cuing          PT Education - 03/12/15 0857    Education provided Yes   Education Details home exercises: thoracic self mobilizaiton/stretch, upper trapezius stretch, ER in side lying, supine bilatera flexion overhead with 4# weight: written instructions given   Person(s) Educated Patient   Methods Explanation;Demonstration;Verbal cues   Comprehension Verbalized  understanding;Returned demonstration;Verbal cues required             PT Long Term Goals - 02/05/15 2010    PT LONG TERM GOAL #1   Title Patient will improve quickDash to 45% or better demonstrating imrpoved UE function with decreased pain by 02/26/2015   Baseline 59% score   Status New   PT LONG TERM GOAL #2   Title Patient will be able to reach back and overhead with right UE with minimal pain to improe personal care and work related activities  by 02/26/2015   Baseline unable to reach back without increased pain limiting movement   Status New   PT LONG TERM GOAL #3   Title QuickDAsh score 30% or less by 03/20/2015 indicating improved self perceived disablity with functional use right UE    Baseline 59% score   Status New   PT LONG TERM GOAL #4   Title Patient will be independent with home program for self management of pain and exercises by 03/20/2015   Baseline patient has limited knowledge of appropriate pain control/progressive exercises for right shoulder pain   Status New               Plan - 03/12/15 0901    Clinical Impression Statement Patient is progressing well towards all goals with decreased right shoulder pain and imrpoving function with using right UE at home with personal and household chores. She continues with spasms and pain right upper trapezius/cervical spine/shoulder and will benefit from additional physical therapy intervention to achieve full pain free functional use of right shoulder.    Pt will benefit from skilled therapeutic intervention in order to improve on the following deficits Increased muscle spasms;Decreased range of motion;Pain;Impaired UE functional use   Rehab Potential Good   PT Frequency 2x / week   PT Duration 6 weeks   PT Treatment/Interventions Manual techniques;Cryotherapy;Electrical Stimulation;Therapeutic exercise;Moist Heat;Ultrasound;Patient/family education   PT Next Visit Plan pain control, ther. ex, manual  therapy  techniques    PT Home Exercise Plan scapular control, posture correction for right shoulder/cervical spine        Problem List There are no active problems to display for this patient.   Beacher May PT 03/12/2015, 10:02 PM  [place] [facility] [address], [place], [address] Phone: [phone]   Fax:  [phone]

## 2015-03-17 ENCOUNTER — Ambulatory Visit: Payer: Managed Care, Other (non HMO) | Admitting: Physical Therapy

## 2015-03-17 ENCOUNTER — Encounter: Payer: Self-pay | Admitting: Physical Therapy

## 2015-03-17 DIAGNOSIS — M25511 Pain in right shoulder: Secondary | ICD-10-CM

## 2015-03-17 DIAGNOSIS — M542 Cervicalgia: Secondary | ICD-10-CM

## 2015-03-17 NOTE — Therapy (Signed)
[place] [facility] [address] [place], [place], [address] Phone: [phone]   Fax:  [phone]  Physical Therapy Treatment  Patient Details  Name: Jennifer Carney MRN: [id number] Date of Birth: 30-Dec-1980 Referring Provider:  Geoffery Lyonsurner, Eric M, [place]  Encounter Date: 03/17/2015      PT End of Session - 03/17/15 1930    Visit Number 11   Number of Visits 12   Date for PT Re-Evaluation 03/20/15   PT Start Time 1820   PT Stop Time 1915   PT Time Calculation (min) 55 min   Activity Tolerance Patient tolerated treatment well   Behavior During Therapy [facility] for tasks assessed/performed      History reviewed. No pertinent past medical history.  History reviewed. No pertinent past surgical history.  There were no vitals filed for this visit.  Visit Diagnosis:  Right shoulder pain  Cervicalgia      Subjective Assessment - 03/17/15 1823    Subjective Patient has returned to work with intermittent burning in her upper trapezius/shoulder region. She is working with decals at work and feels that the positioning of shoulder aggravates her pain.    Patient Stated Goals to get rid of pain and be able to use right UE again normally without pain/difficulty   Currently in Pain? Yes   Pain Score 2    Pain Location Shoulder   Pain Orientation Right   Pain Descriptors / Indicators Burning;Aching   Pain Type Chronic pain   Pain Onset More than a month ago   Pain Frequency Intermittent   Multiple Pain Sites No      Objective: Observed positioning of patient's shoulder during job related task with decals (shoulder at 90 degrees and internally rotated)          [facility]/Exercise - 03/17/15 1827    Exercises   Exercises Other Exercises   Other Exercises  reassessed thoracic mobilization/stretch with towel, standing scapular rows and shoulder extension to hips x 10 rpes each with verbal cues and demonstration, side  lying ER with weight, supine hook lying with 4# weight bilateral forward flexion overhead, self stretching of upper trapezius. at [facility] scapular rows 10# double, seated reverse chin up 15# x 10 reps, standing straight arm pull downs 15# with verbal cues for good alignment and not to hike shoulder   Modalities   Modalities Electrical Stimulation;Cryotherapy;Ultrasound   Cryotherapy   Number Minutes Cryotherapy 20 Minutes   Cryotherapy Location Shoulder   Type of Cryotherapy Ice pack   Electrical Stimulation   Electrical Stimulation Location right shoulder    Electrical Stimulation Parameters high volt (2) electrodes appllied to anterior and upper trapezius muscle, Russian stim. with electrodes placed alonog medial aspect of scapula 10/10 cycle   Electrical Stimulation Goals Pain;Neuromuscular facilitation          Manual Therapy   Manual Therapy Soft tissue mobilization;Myofascial release   Soft tissue mobilization upper trapezius, cervical spine, with rotation ROM and stretch at end ranges   Myofascial Release upper trapezius release 2 x 30 seconds, sub occipital release with patient supine lying     Patient response to treatment: Patient required verbal cuing and guidance to perform exercises at [facility] and review of home program, improved soft tissue elasticity and ROM cervical spine rotation to right following STM, suboccipital release, decreased pain and spasms in right shoulder to 1/10 following high volt estim.  PT Education - 03/17/15 1900    Education provided Yes   Education Details Instructed in home exercises to continue for strengthening and self management of pain   Person(s) Educated Patient   Methods Explanation;Demonstration   Comprehension Verbalized understanding;Returned demonstration;Verbal cues required             PT Long Term Goals - 02/05/15 2010    PT LONG TERM GOAL #1   Title Patient will improve quickDash to 45% or better demonstrating  imrpoved UE function with decreased pain by 02/26/2015   Baseline 59% score   Status New   PT LONG TERM GOAL #2   Title Patient will be able to reach back and overhead with right UE with minimal pain to improe personal care and work related activities  by 02/26/2015   Baseline unable to reach back without increased pain limiting movement   Status New   PT LONG TERM GOAL #3   Title QuickDAsh score 30% or less by 03/20/2015 indicating improved self perceived disablity with functional use right UE    Baseline 59% score   Status New   PT LONG TERM GOAL #4   Title Patient will be independent with home program for self management of pain and exercises by 03/20/2015   Baseline patient has limited knowledge of appropriate pain control/progressive exercises for right shoulder pain   Status New               Plan - 03/17/15 1930    Clinical Impression Statement Patient is progressing well towards all goals with decreasing pain in right shoulder and improving function with work related tasks and household chores. She continues with weakness and pain in right shoulder and will benefit from additional physical therapy intervention to achieve goals.    Pt will benefit from skilled therapeutic intervention in order to improve on the following deficits Increased muscle spasms;Decreased range of motion;Pain;Impaired UE functional use   Rehab Potential Good   PT Frequency 2x / week   PT Duration 6 weeks   PT Treatment/Interventions Manual techniques;Cryotherapy;Electrical Stimulation;Therapeutic exercise;Ultrasound;Patient/family education   PT Next Visit Plan pain control, ther. ex, manual  therapy techniques         Problem List There are no active problems to display for this patient.   Beacher May PT 03/18/2015, 11:14 PM  [facility] PHYSICAL AND SPORTS MEDICINE [address] [place], [address] Phone: [phone]   Fax:   [phone]

## 2015-03-19 ENCOUNTER — Encounter: Payer: Self-pay | Admitting: Physical Therapy

## 2015-03-19 ENCOUNTER — Ambulatory Visit: Payer: Managed Care, Other (non HMO) | Admitting: Physical Therapy

## 2015-03-19 DIAGNOSIS — M25511 Pain in right shoulder: Secondary | ICD-10-CM

## 2015-03-19 DIAGNOSIS — M542 Cervicalgia: Secondary | ICD-10-CM

## 2015-03-20 NOTE — Therapy (Signed)
[facility] [address], [place], [address] Phone: [phone]   Fax:  [phone]  Physical Therapy Treatment  Patient Details  Name: Jennifer Carney MRN: [id number] Date of Birth: [birth year] Referring Provider:  Christie [place], [place]  Encounter Date: 03/19/2015      PT End of Session - 03/19/15 1930    Visit Number 12   Number of Visits 12   Date for PT Re-Evaluation 04/23/15   PT Start Time 1815   PT Stop Time 1912   PT Time Calculation (min) 57 min   Activity Tolerance Patient tolerated treatment well   Behavior During Therapy [facility] for tasks assessed/performed      History reviewed. No pertinent past medical history.  History reviewed. No pertinent past surgical history.  There were no vitals filed for this visit.  Visit Diagnosis:  Right shoulder pain - Plan: PT plan of care cert/re-cert  Cervicalgia - Plan: PT plan of care cert/re-cert      Subjective Assessment - 03/19/15 1822    Subjective Patient reports she is doing better and still has intermittent pain/burning in her right side upper trapezius and shouler region. She is still working with decals at work and feels that she would benefit from additional physical therapy to further strengthen right shoulder/UE in order to prevent re occurrence and be able to return to prior level of function.    Patient Stated Goals to get rid of pain and be able to use right UE again normally without pain/difficulty   Currently in Pain? Yes   Pain Score 2    Pain Location Shoulder  upper trapezius with stiffness and knots (spasms)   Pain Orientation Right   Pain Descriptors / Indicators Tightness   Pain Type Chronic pain   Pain Onset More than a month ago   Pain Frequency Intermittent   Pain Relieving Factors rest,  therapy treatment with STM and estim.    Effect of Pain on Daily Activities unable to use air tool (gun) and overhead and reaching out to right  side and behind her   Multiple Pain Sites No      Treatment: Manual Therapy: STM right upper trapezius, cervical spine with patient seated, superficial and deep releases, PA mobilization thoracic spine T 1-7 grade 2-3 x 3 reps each segment, upper trapezius stretch and levator stretch x 3 reps x 20-30 second holds each with instruction to patient to add to home program.  Therapeutic exercises: OMEGA: scapular row, lat pull down, reverse chin up with 15# and guided motion/technique with verbal cuing 2 x 15 reps each Electrical stimulation: high volt (2) electrodes applied to right upper trapezius muscle and anterior aspect of shoulder, Russian stim. Applied along medial border of scapula 10/10 cycle with patient seated in chair, intensity to tolerance, with ice pack applied to right shoulder during estim. X 20 min.   Goal: pain, neuromuscular facilitation   Patient response to treatment: improved control, strength with guided exercises, decreased spasms and pain reported by at least 25% with estim. And improved posture with decreased hiking right shoulder with manual therapy techniques         PT Education - 03/19/15 1920    Education provided Yes   Education Details instructed in thoracic spine mobilization and trapezius stretch and levator stretch   Person(s) Educated Patient   Methods Explanation;Demonstration;Tactile cues;Verbal cues   Comprehension Verbalized understanding;Returned demonstration  PT Long Term Goals - 03/19/15 1930    PT LONG TERM GOAL #1   Title Patient will improve quickDash to 45% or better demonstrating imrpoved UE function with decreased pain by 02/26/2015   Baseline 59% score   Status Achieved   PT LONG TERM GOAL #2   Title Patient will be able to reach back and overhead with right UE with minimal pain to improe personal care and work related activities  by 02/26/2015 continue with goal: 04/23/2015   Baseline unable to reach back without  increased pain limiting movement   Status Partially Met/Revised   PT LONG TERM GOAL #3   Title QuickDAsh score 30% or less by 03/20/2015 indicating improved self perceived disablity with functional use right UE    Baseline 59% score   Status Achieved   PT LONG TERM GOAL #4   Title Patient will be independent with home program for self management of pain and exercises by 04/23/2015   Baseline patient has limited knowledge of appropriate pain control/progressive exercises for right shoulder pain   Status Revised   PT LONG TERM GOAL #5   Title Patient will improve quickDash scores to <10% demonstrating improved functional use right UE with minimal right shoulder pain and stiffness by 04/23/2015   Baseline quick Dash score 25% for work, 16% daily tasks   Status Revised               Plan - 03/19/15 1930    Clinical Impression Statement Patient is progressing towards goals with decreasing pain in right shoulder and improving ability to use right UE at work and for [employer] chores. She is limited in overhead use of right UE and reaching and lifting and continues with impingement signs and will therefore benefit from additional physical therapy intervention to further strengthen, decrease pain and improve functional use of right UE to return to prior level of function.    Pt will benefit from skilled therapeutic intervention in order to improve on the following deficits Increased muscle spasms;Decreased range of motion;Pain;Impaired UE functional use   Rehab Potential Good   PT Frequency 2x / week   PT Duration 6 weeks   PT Treatment/Interventions Manual techniques;Cryotherapy;Electrical Stimulation;Therapeutic exercise;Ultrasound;Patient/family education   PT Next Visit Plan pain control, ther. ex, manual  therapy techniques         Problem List There are no active problems to display for this patient.   Jomarie Longs PT 03/20/2015, 3:27 PM  [facility] [address] [place], [address] Phone: [phone]   Fax:  [phone]

## 2015-03-24 ENCOUNTER — Encounter: Payer: Self-pay | Admitting: Physical Therapy

## 2015-03-24 ENCOUNTER — Ambulatory Visit: Payer: Managed Care, Other (non HMO) | Admitting: Physical Therapy

## 2015-03-24 DIAGNOSIS — M25511 Pain in right shoulder: Secondary | ICD-10-CM | POA: Diagnosis not present

## 2015-03-24 DIAGNOSIS — M542 Cervicalgia: Secondary | ICD-10-CM

## 2015-03-25 ENCOUNTER — Encounter: Payer: Self-pay | Admitting: Physical Therapy

## 2015-03-25 ENCOUNTER — Ambulatory Visit: Payer: Managed Care, Other (non HMO) | Admitting: Physical Therapy

## 2015-03-25 DIAGNOSIS — M542 Cervicalgia: Secondary | ICD-10-CM

## 2015-03-25 DIAGNOSIS — M25511 Pain in right shoulder: Secondary | ICD-10-CM | POA: Diagnosis not present

## 2015-03-25 NOTE — Therapy (Signed)
[facility] [address], [place], [address] Phone: [phone]   Fax:  [phone]  Physical Therapy Treatment  Patient Details  Name: Jennifer Carney MRN: [id number] Date of Birth: 1980/10/30 Referring Provider:  Geoffery Lyons, [place]  Encounter Date: 03/24/2015      PT End of Session - 03/24/15 1904    Visit Number 13   Number of Visits 24   Date for PT Re-Evaluation 04/23/15   PT Start Time 1835   PT Stop Time 1925   PT Time Calculation (min) 50 min   Activity Tolerance Patient tolerated treatment well   Behavior During Therapy [facility] for tasks assessed/performed      History reviewed. No pertinent past medical history.  History reviewed. No pertinent past surgical history.  There were no vitals filed for this visit.  Visit Diagnosis:  Right shoulder pain  Cervicalgia      Subjective Assessment - 03/24/15 1835    Subjective Patient reports she is improving with use of right UE for work related and household activities.    Patient Stated Goals to get rid of pain and be able to use right UE again normally without pain/difficulty   Currently in Pain? Yes   Pain Score 4    Pain Location Shoulder  right upper trapezius and cervical spine stiffness   Pain Orientation Right   Pain Descriptors / Indicators Tightness   Pain Type Chronic pain   Pain Onset More than a month ago   Pain Frequency Intermittent   Multiple Pain Sites No     Objective: Palpation: cervical spine posterior aspect suboccipital region right side and upper trapezius                    [facility] Adult PT Treatment/Exercise - 03/24/15 1859    Exercises   Exercises Other Exercises   Other Exercises   at OMEGA scapular rows 10# double, 5# single arm rows x 10 each with verbal cuing and tactile cues to prevent hiking of shoulder and keeping shoulder in good alignment, seated reverse chin up 15# x 10 reps, standing straight  arm pull downs 15# with verbal cues for good alignment and not to hike shoulder, upper trapezius stretch right and left 3 reps 20 second holds and levator stretch 3 reps 20 seconds each side   Modalities   Modalities Electrical Stimulation;Cryotherapy;Ultrasound   Cryotherapy   Number Minutes Cryotherapy 20 Minutes   Cryotherapy Location Shoulder   Electrical Stimulation   Electrical Stimulation Location right shoulder    Electrical Stimulation Parameters high volt (2) electrodes applied to anterior and upper trpaezius muscle, Russian stim. with electrodes palced along medial aspect of scapula 01/10 cycle   Electrical Stimulation Goals Pain;Neuromuscular facilitation   Manual Therapy   Manual Therapy Soft tissue mobilization   Soft tissue mobilization With patient seated; STM cervical spine/upper trapezius right side   Myofascial Release      Patient response to treatment: improved soft tissue elasticity and improved ability to raise right UE above shoulder level with less difficulty and pain           PT Education - 03/24/15 1904    Education provided Yes   Education Details Instructed to continue with spine mobilizaiton and upper trapezius stretch to control stiffness   Person(s) Educated Patient   Methods Explanation;Demonstration;Verbal cues   Comprehension Verbalized understanding;Returned demonstration;Verbal cues required  PT Long Term Goals - 03/19/15 1930    PT LONG TERM GOAL #1   Title Patient will improve quickDash to 45% or better demonstrating imrpoved UE function with decreased pain by 02/26/2015   Baseline 59% score   Status Achieved   PT LONG TERM GOAL #2   Title Patient will be able to reach back and overhead with right UE with minimal pain to improe personal care and work related activities  by 02/26/2015 continue with goal : 04/23/2015   Baseline unable to reach back without increased pain limiting movement   Status Revised   PT LONG TERM  GOAL #3   Title QuickDAsh score 30% or less by 03/20/2015 indicating improved self perceived disablity with functional use right UE    Baseline 59% score   Status Achieved   PT LONG TERM GOAL #4   Title Patient will be independent with home program for self management of pain and exercises by 04/23/2015   Baseline patient has limited knowledge of appropriate pain control/progressive exercises for right shoulder pain   Status Revised   PT LONG TERM GOAL #5   Title Patient will improve quickDash scores to <10% demonstrating improved functional use right UE with minimal right shoulder pain and stiffness by 04/23/2015   Baseline quick Dash score 25% for work, 16% daily tasks   Status Revised               Plan - 03/24/15 1905    Clinical Impression Statement Patient is progressing towards goals. She is now able to perform personal care and work related tasks with less difficulty and pain in right shoulder. She continues with spasms and pain and limited ability to perform all duties without difficulty and will therefore benefit from additional physical therapy intervention to return to full function with right LE.    Pt will benefit from skilled therapeutic intervention in order to improve on the following deficits Increased muscle spasms;Decreased range of motion;Pain;Impaired UE functional use   Rehab Potential Good   PT Frequency 2x / week   PT Duration 6 weeks   PT Treatment/Interventions Manual techniques;Cryotherapy;Electrical Stimulation;Therapeutic exercise;Ultrasound;Patient/family education   PT Next Visit Plan pain control, ther. ex, manual  therapy techniques         Problem List There are no active problems to display for this patient.   Beacher MayBrooks, Marie PT 03/25/2015, 4:20 PM  [facility] [address] [place], [place], [address] Phone: [phone]   Fax:  [phone]

## 2015-03-25 NOTE — Therapy (Signed)
[place] [facility] [address], [place], [address] Phone: [phone]   Fax:  [phone]  Physical Therapy Treatment  Patient Details  Name: Jennifer Carney MRN: [id number] Date of Birth: [birth year] Referring Provider:  Geoffery Lyons, [place]  Encounter Date: 03/25/2015      PT End of Session - 03/25/15 1838    Visit Number 14   Number of Visits 24   Date for PT Re-Evaluation 04/23/15   PT Start Time 1752   PT Stop Time 1836   PT Time Calculation (min) 44 min   Activity Tolerance Patient tolerated treatment well   Behavior During Therapy [facility] for tasks assessed/performed      History reviewed. No pertinent past medical history.  History reviewed. No pertinent past surgical history.  There were no vitals filed for this visit.  Visit Diagnosis:  Right shoulder pain  Cervicalgia      Subjective Assessment - 03/25/15 1753    Subjective Patient reports she is improving with use of right UE for work related and household activities. She still has increased pain/tightness in right side of neck and shoulder with holding her arm up and with repetitive motions such as with work related tasks.   Patient Stated Goals to get rid of pain and be able to use right UE again normally without pain/difficulty   Currently in Pain? Yes   Pain Score 3    Pain Location --  right upper trapezius and cervical spine stiffness, aching   Pain Orientation Right   Pain Descriptors / Indicators Aching;Tightness   Pain Type Chronic pain   Pain Onset More than a month ago   Pain Frequency Intermittent   Multiple Pain Sites No            [facility] Adult PT Treatment/Exercise - 03/25/15 0001    Exercises   Exercises Other Exercises   Other Exercises  supine lying: stabilizaiton at 90 degrees elevation x 10 reps, ER/IR x 10 reps with shoulder in neutral position, standing single arm row with 5# weight x 15 reps   Modalities   Modalities  Electrical Stimulation;Cryotherapy;Ultrasound   Cryotherapy   Number Minutes Cryotherapy 20 Minutes   Cryotherapy Location Shoulder   Electrical Stimulation   Electrical Stimulation Location right shoulder    Electrical Stimulation Parameters high volt (2) electrodes applied to posterior and upper trpezius muscles, russian stim. applied to right shoulder/medial border of scpaula nd lower trapezius muscles 10/10 cycle   Electrical Stimulation Goals Pain;Neuromuscular facilitation   Manual Therapy   Manual Therapy Soft tissue mobilization;Myofascial release   Soft tissue mobilization right shoulder upper tarpezius release and stretching including rotations cervical spine and lateral flexion with holding end ranges x 20 seconds each   Myofascial Release suboccipital release with patient supine lying       Patient response to treatment: decreased pain to 2/10, improved soft tissue elasticity and flexibility in cervical spine and right shoulder. Patient able to tolerate increased resistance with shoulder elevation and guidance of therapist           PT Education - 03/25/15 1830    Education provided Yes   Education Details Reinforced home exercises including upper trapezius stretch, rhythmic stabilizaiton exericse    Person(s) Educated Patient   Methods Explanation;Demonstration;Verbal cues   Comprehension Verbalized understanding;Returned demonstration;Verbal cues required             PT Long Term Goals - 03/19/15 1930    PT  LONG TERM GOAL #1   Title Patient will improve quickDash to 45% or better demonstrating imrpoved UE function with decreased pain by 02/26/2015   Baseline 59% score   Status Achieved   PT LONG TERM GOAL #2   Title Patient will be able to reach back and overhead with right UE with minimal pain to improe personal care and work related activities  by 02/26/2015 continue with goal : 04/23/2015   Baseline unable to reach back without increased pain limiting  movement   Status Revised   PT LONG TERM GOAL #3   Title QuickDAsh score 30% or less by 03/20/2015 indicating improved self perceived disablity with functional use right UE    Baseline 59% score   Status Achieved   PT LONG TERM GOAL #4   Title Patient will be independent with home program for self management of pain and exercises by 04/23/2015   Baseline patient has limited knowledge of appropriate pain control/progressive exercises for right shoulder pain   Status Revised   PT LONG TERM GOAL #5   Title Patient will improve quickDash scores to <10% demonstrating improved functional use right UE with minimal right shoulder pain and stiffness by 04/23/2015   Baseline quick Dash score 25% for work, 16% daily tasks   Status Revised               Plan - 03/25/15 1845    Clinical Impression Statement Patient demonstrated improved soft tissue elasticity and flexibility in upper trapezius muscles and cervical spine with treatment. She continues with decresad endurance and strength and pain in right shoulder with prolonged positions and repetition with work related activities.    Pt will benefit from skilled therapeutic intervention in order to improve on the following deficits Increased muscle spasms;Decreased range of motion;Pain;Impaired UE functional use   Rehab Potential Good   PT Frequency 2x / week   PT Duration 6 weeks   PT Treatment/Interventions Manual techniques;Cryotherapy;Electrical Stimulation;Therapeutic exercise;Ultrasound;Patient/family education   PT Next Visit Plan pain control, ther. ex, manual  therapy techniques         Problem List There are no active problems to display for this patient.   Beacher MayBrooks, Kaoir Loree PT 03/25/2015, 11:44 PM  [place] [facility] [address] [place], [place], [id number] Phone: [phone]   Fax:  [phone]

## 2015-03-26 ENCOUNTER — Encounter: Payer: Managed Care, Other (non HMO) | Admitting: Physical Therapy

## 2015-04-01 ENCOUNTER — Encounter: Payer: Self-pay | Admitting: Physical Therapy

## 2015-04-01 ENCOUNTER — Ambulatory Visit: Payer: Managed Care, Other (non HMO) | Admitting: Physical Therapy

## 2015-04-01 DIAGNOSIS — M25511 Pain in right shoulder: Secondary | ICD-10-CM | POA: Diagnosis not present

## 2015-04-01 DIAGNOSIS — M542 Cervicalgia: Secondary | ICD-10-CM

## 2015-04-02 ENCOUNTER — Ambulatory Visit: Payer: Managed Care, Other (non HMO) | Admitting: Physical Therapy

## 2015-04-02 ENCOUNTER — Encounter: Payer: Self-pay | Admitting: Physical Therapy

## 2015-04-02 DIAGNOSIS — M25511 Pain in right shoulder: Secondary | ICD-10-CM | POA: Diagnosis not present

## 2015-04-02 DIAGNOSIS — M542 Cervicalgia: Secondary | ICD-10-CM

## 2015-04-02 DIAGNOSIS — M6281 Muscle weakness (generalized): Secondary | ICD-10-CM

## 2015-04-02 NOTE — Therapy (Signed)
[place] [facility] [address], [place], [address] Phone: [phone]   Fax:  [phone]  Physical Therapy Treatment  Patient Details  Name: Jennifer Carney MRN: [id number] Date of Birth: 1980/12/22 Referring Provider:  Geoffery Lyons, [place]  Encounter Date: 04/01/2015      PT End of Session - 04/01/15 1930    Visit Number 15   Number of Visits 24   Date for PT Re-Evaluation 04/23/15   PT Start Time 1827   PT Stop Time 1918   PT Time Calculation (min) 51 min   Activity Tolerance Patient tolerated treatment well   Behavior During Therapy [facility] for tasks assessed/performed      History reviewed. No pertinent past medical history.  History reviewed. No pertinent past surgical history.  There were no vitals filed for this visit.  Visit Diagnosis:  Right shoulder pain  Cervicalgia      Subjective Assessment - 04/01/15 1828    Subjective Patient reports she is getting better however when she lies on right side her shoulder and neck pain increase. 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    Patient Stated Goals to get rid of pain and be able to use right UE again normally without pain/difficulty   Currently in Pain? Yes   Pain Score 3    Pain Location --  right shoulder/upper trapezius and lower cervical spine   Pain Orientation Right   Pain Descriptors / Indicators Aching;Tightness   Pain Type Chronic pain   Pain Onset More than a month ago   Multiple Pain Sites No       objective: Palpation: + pain and spasms with increased thickness along sub occipital region on right and moderate spasms in right upper trapezius AROM: decreased rotation cervical spine to right, full AROM in right shoulder with pain at full forward elevation Strength: decreased lower trapezius and rhomboid strength noted with exercises                 [facility] Adult PT  Treatment/Exercise - 04/01/15 1830    Exercises   Exercises Other Exercises   Other Exercises  at OMEGA scapular rows 2 x 10# double, 2 x 5 -10 reps with 5# single arm rows  with verbal cuing and tactile cues to prevent hiking of shoulder and keeping shoulder in good alignment, seated reverse chin up 15# x 10 reps, standing straight arm pull downs 1 x 10 with 10# and 1 x 10 with15# with verbal cues for good alignment and not to hike shoulder   Modalities   Modalities Electrical Stimulation;Cryotherapy              Cryotherapy   Number Minutes Cryotherapy 20 Minutes   Cryotherapy Location Shoulder   Type of Cryotherapy Ice pack   Electrical Stimulation   Electrical Stimulation Location right shoulder    Electrical Stimulation Parameters high volt estim upper trap, posterior shoulder, Russian stim 10/10 cycle to right scapula, medial border and lower trapezius muscle   Electrical Stimulation Goals Pain;Neuromuscular facilitation          Manual therapy   Soft tissue mobilization STM cervical spine right side into upper trapezius with patient seated prior to exercise, superficial techniques              Patient response to treatment: improved soft tissue elasticity and able to perform exercises with minimal verbal  cuing. She required verbal cuing to perform exercises for strengthening for correct shoulder alignment, decreased stiffness and pain by 50% following estim.           PT Education - 04/01/15 1900    Education provided Yes   Education Details educated in exercises for strengthening UE's, shoulders, back and chest and use of resistive bands to perform exercises at home   Person(s) Educated Patient   Methods Explanation;Demonstration;Verbal cues   Comprehension Verbalized understanding;Returned demonstration;Verbal cues required             PT Long Term Goals - 03/19/15 1930    PT LONG TERM GOAL #1   Title Patient will improve quickDash to 45% or better  demonstrating imrpoved UE function with decreased pain by 02/26/2015   Baseline 59% score   Status Achieved   PT LONG TERM GOAL #2   Title Patient will be able to reach back and overhead with right UE with minimal pain to improe personal care and work related activities  by 02/26/2015 continue with goal : 04/23/2015   Baseline unable to reach back without increased pain limiting movement   Status Revised   PT LONG TERM GOAL #3   Title QuickDAsh score 30% or less by 03/20/2015 indicating improved self perceived disablity with functional use right UE    Baseline 59% score   Status Achieved   PT LONG TERM GOAL #4   Title Patient will be independent with home program for self management of pain and exercises by 04/23/2015   Baseline patient has limited knowledge of appropriate pain control/progressive exercises for right shoulder pain   Status Revised   PT LONG TERM GOAL #5   Title Patient will improve quickDash scores to <10% demonstrating improved functional use right UE with minimal right shoulder pain and stiffness by 04/23/2015   Baseline quick Dash score 25% for work, 16% daily tasks   Status Revised               Problem List There are no active problems to display for this patient.   Beacher May PT 04/02/2015, 8:29 AM  [place] [facility] [address], [place], [address] Phone: [phone]   Fax:  [phone]

## 2015-04-03 NOTE — Therapy (Signed)
[place] [facility]INE [address] [place], [address] Phone: [phone]   Fax:  [phone]  Physical Therapy Treatment  Patient Details  Name: Jennifer Carney MRN: [id number] Date of Birth: 1981/05/06 Referring Provider:  Geoffery Lyons, [place]  Encounter Date: 04/02/2015      PT End of Session - 04/02/15 1930    Visit Number 16   Number of Visits 24   Date for PT Re-Evaluation 04/23/15   PT Start Time 1837   PT Stop Time 1925   PT Time Calculation (min) 48 min   Activity Tolerance Patient tolerated treatment well   Behavior During Therapy [facility] for tasks assessed/performed      History reviewed. No pertinent past medical history.  History reviewed. No pertinent past surgical history.  There were no vitals filed for this visit.  Visit Diagnosis:  Right shoulder pain  Cervicalgia  Muscle weakness      Subjective Assessment - 04/02/15 1838    Subjective Patient reports no problems from previous session with exercises. She still cannot lie on right side and is having mild stiffness right side of neck today   Patient Stated Goals to get rid of pain and be able to use right UE again normally without pain/difficulty   Currently in Pain? Yes   Pain Score 2    Pain Location Other (Comment)  right shoulder into right side of c/spine with stiffness a major complaint   Pain Orientation Right   Pain Descriptors / Indicators Aching;Tightness   Pain Type Chronic pain   Pain Onset More than a month ago   Pain Frequency Intermittent   Multiple Pain Sites No      Palpation: + spasms and decreased soft tissue mobility cervical spine/right side, suboccipitally and right upper trapezius       [facility] Adult PT Treatment/Exercise - 04/02/15 1840    Exercises   Exercises Other Exercises   Other Exercises  Supine lying: scapular adduction x 10, rhythmic stabilization right shoulder with shoulder at 90 degrees elevation 3 x  10 reps, ER/IR stabilization with shoulder in plane of the scapula 3 x 10 reps, side lying scapular control exercise for lower trapezius (patient fatigued)   Modalities   Modalities Electrical Stimulation;Cryotherapy   Moist Heat Therapy   Moist Heat Location Shoulder   Cryotherapy   Number Minutes Cryotherapy 20 Minutes   Cryotherapy Location Shoulder   Type of Cryotherapy Ice pack   Electrical Stimulation   Electrical Stimulation Location right shoulder    Electrical Stimulation Parameters high volt (2) electrodes applied to anterior and upper trapezius muscle, russian stim.. with electrodes placed along medial border and lower trapezius muscle 10/10 cycle   Electrical Stimulation Goals Pain;Neuromuscular facilitation   Ultrasound   Ultrasound Goals Pain   Manual Therapy   Manual Therapy Soft tissue mobilization;Myofascial release   Soft tissue mobilization right shoulder upper trapezius release and stretchingg   Myofascial Release suboccipital release with patient supine lying      Patient response to treatment: patient fatigued today therefore limited exercise performed, she did improve soft tissue elasticity in right upper trapezius and cervical spine/sub occipital region with decreased stiffness noted and improved flexibility in cervical spine, decreased reported stiffness and pain in right shoulder/neck at end of session to mild          PT Education - 04/02/15 1915    Education provided Yes   Education Details re assessed home program to conitnue  with scapular stabilization and upper trapezius stretches    Person(s) Educated Patient   Methods Explanation;Demonstration   Comprehension Verbalized understanding             PT Long Term Goals - 03/19/15 1930    PT LONG TERM GOAL #1   Title Patient will improve quickDash to 45% or better demonstrating imrpoved UE function with decreased pain by 02/26/2015   Baseline 59% score   Status Achieved   PT LONG TERM GOAL #2    Title Patient will be able to reach back and overhead with right UE with minimal pain to improe personal care and work related activities  by 02/26/2015 continue with goal : 04/23/2015   Baseline unable to reach back without increased pain limiting movement   Status Revised   PT LONG TERM GOAL #3   Title QuickDAsh score 30% or less by 03/20/2015 indicating improved self perceived disablity with functional use right UE    Baseline 59% score   Status Achieved   PT LONG TERM GOAL #4   Title Patient will be independent with home program for self management of pain and exercises by 04/23/2015   Baseline patient has limited knowledge of appropriate pain control/progressive exercises for right shoulder pain   Status Revised   PT LONG TERM GOAL #5   Title Patient will improve quickDash scores to <10% demonstrating improved functional use right UE with minimal right shoulder pain and stiffness by 04/23/2015   Baseline quick Dash score 25% for work, 16% daily tasks   Status Revised               Plan - 04/02/15 1930    Clinical Impression Statement Patient demonstrated improved soft tissue elasticity and flexibiltiy in cervical spine/upper trapezius with manual therapy techniques and able to perform exercise with minimal assistance/cuing for correct alignement of right shoulder. patient was tired today which limited exercises performed.    Pt will benefit from skilled therapeutic intervention in order to improve on the following deficits Increased muscle spasms;Decreased range of motion;Pain;Impaired UE functional use   Rehab Potential Good   PT Frequency 2x / week   PT Duration 6 weeks   PT Treatment/Interventions Manual techniques;Cryotherapy;Electrical Stimulation;Therapeutic exercise;Ultrasound;Patient/family education   PT Next Visit Plan pain control, ther. ex, manual  therapy techniques         Problem List There are no active problems to display for this patient.   Beacher May  PT 04/03/2015, 11:48 AM  [place] [facility] PHYSICAL AND SPORTS MEDICINE [address] [place], [address] Phone: [phone]   Fax:  [phone]

## 2015-04-07 ENCOUNTER — Ambulatory Visit: Payer: Managed Care, Other (non HMO) | Attending: Physician Assistant | Admitting: Physical Therapy

## 2015-04-07 DIAGNOSIS — M25511 Pain in right shoulder: Secondary | ICD-10-CM | POA: Insufficient documentation

## 2015-04-07 DIAGNOSIS — M6281 Muscle weakness (generalized): Secondary | ICD-10-CM | POA: Insufficient documentation

## 2015-04-07 DIAGNOSIS — M542 Cervicalgia: Secondary | ICD-10-CM | POA: Diagnosis present

## 2015-04-07 NOTE — Therapy (Signed)
[place] [facility] PH[facility]TS [facility]ICINE [address], [place], [address] Phone: [phone]   Fax:  [phone]  Physical Therapy Treatment  Patient Details  Name: Jennifer Carney MRN: [id number] Date of Birth: 1981-08-12 Referring Provider:  Geoffery Lyons, [place]  Encounter Date: 04/07/2015      PT End of Session - 04/07/15 1910    Visit Number 17   Number of Visits 24   Date for PT Re-Evaluation 04/23/15   PT Start Time 1817   PT Stop Time 1900   PT Time Calculation (min) 43 min   Activity Tolerance Patient tolerated treatment well   Behavior During Therapy [facility] for tasks assessed/performed      No past medical history on file.  No past surgical history on file.  There were no vitals filed for this visit.  Visit Diagnosis:  Right shoulder pain  Cervicalgia  Muscle weakness      Subjective Assessment - 04/07/15 1828    Subjective Patient reports she continues with pain in right side of neck and upper trapezius muscle. She still cannot sleep on right side and has difficutly with performing tasks at work and home. She will benefit from continued physical therapy to progress with exercises in order to self manage symptoms and home program.    Patient Stated Goals to get rid of pain and be able to use right UE again normally without pain/difficulty   Currently in Pain? Yes   Pain Score 3    Pain Location Other (Comment)  right side of neck into upper trapezius muscle and stiffness on right side   Pain Orientation Right   Pain Type Chronic pain      Objective: + spasms palpable cervical spine suboccipital region and right side paraspinal muscles AROM: cervical spine rotations WFL's with increased stiffness end range right rotation       [facility] Adult PT Treatment/Exercise - 04/07/15 2135    Exercises   Exercises Other Exercises   Other Exercises  Supine lying: stretching upper trapezius 3 reps 20 second holds, rotations x 3  reps each direction, isometric rotations x 3 reps 10 second holds, cervical spine extension x 5 reps with manual resistance 5 second holds   Modalities   Modalities Electrical Stimulation;Cryotherapy          Cryotherapy   Number Minutes Cryotherapy 20 Minutes   Cryotherapy Location Shoulder   Electrical Stimulation   Electrical Stimulation Location right shoulder    Electrical Stimulation Parameters high volt (2) electrodes applied to anterior aspect right shoulder and upper trapezius muscle, russian stim. with electrodes placed along medial aspect of scapula 10/10 cycle   Electrical Stimulation Goals Pain;Neuromuscular facilitation          Manual Therapy   Manual Therapy Soft tissue mobilization;Myofascial release   Soft tissue mobilization right shoulder upper trapezius release and stretchingg   Myofascial Release suboccipital release with patient supine lying      Patient response to treatment: improved flexibility in upper trapezius and with cervical spine rotation right and left, improved technique with isometric exercises with repetition and verbal cuing          PT Education - 04/07/15 1910    Education provided Yes   Education Details Instructed to add rotations in supine lying and isometric cervical spine extension in supine lying   Person(s) Educated Patient   Methods Explanation;Demonstration   Comprehension Verbalized understanding;Returned demonstration  PT Long Term Goals - 03/19/15 1930    PT LONG TERM GOAL #1   Title Patient will improve quickDash to 45% or better demonstrating imrpoved UE function with decreased pain by 02/26/2015   Baseline 59% score   Status Achieved   PT LONG TERM GOAL #2   Title Patient will be able to reach back and overhead with right UE with minimal pain to improe personal care and work related activities  by 02/26/2015 continue with goal : 04/23/2015   Baseline unable to reach back without increased pain limiting  movement   Status Revised   PT LONG TERM GOAL #3   Title QuickDAsh score 30% or less by 03/20/2015 indicating improved self perceived disablity with functional use right UE    Baseline 59% score   Status Achieved   PT LONG TERM GOAL #4   Title Patient will be independent with home program for self management of pain and exercises by 04/23/2015   Baseline patient has limited knowledge of appropriate pain control/progressive exercises for right shoulder pain   Status Revised   PT LONG TERM GOAL #5   Title Patient will improve quickDash scores to <10% demonstrating improved functional use right UE with minimal right shoulder pain and stiffness by 04/23/2015   Baseline quick Dash score 25% for work, 16% daily tasks   Status Revised               Plan - 04/07/15 1913    Clinical Impression Statement Patient deonstrated improved ROM into rotations and improved upper trapezius flexibility following treatment. She continues with weakness in right UE and stiffness in cervical spine which limits full funciton with daily tasks for work and personal care. She will benefit from continued physical therapy intervention for pain control and progressive exercise.    Pt will benefit from skilled therapeutic intervention in order to improve on the following deficits Increased muscle spasms;Decreased range of motion;Pain;Impaired UE functional use   Rehab Potential Good   PT Frequency 2x / week   PT Duration 6 weeks   PT Treatment/Interventions Manual techniques;Cryotherapy;Electrical Stimulation;Therapeutic exercise;Ultrasound;Patient/family education   PT Next Visit Plan pain control, ther. ex, manual  therapy techniques         Problem List There are no active problems to display for this patient.   Beacher May PT 04/07/2015, 10:19 PM  [place] [facility] PHYSICAL [facility] SPORTS MEDICINE [address], [place], [address] Phone: [phone]   Fax:   [phone]

## 2015-04-09 ENCOUNTER — Encounter: Payer: Self-pay | Admitting: Physical Therapy

## 2015-04-09 ENCOUNTER — Ambulatory Visit: Payer: Managed Care, Other (non HMO) | Admitting: Physical Therapy

## 2015-04-09 DIAGNOSIS — M542 Cervicalgia: Secondary | ICD-10-CM

## 2015-04-09 DIAGNOSIS — M25511 Pain in right shoulder: Secondary | ICD-10-CM | POA: Diagnosis not present

## 2015-04-09 DIAGNOSIS — M6281 Muscle weakness (generalized): Secondary | ICD-10-CM

## 2015-04-09 NOTE — Therapy (Signed)
[place] [facility] PHYS[facility]ORTS MEDICINE [address] [place], [address] Phone: [phone]   Fax:  [phone]  Physical Therapy Treatment  Patient Details  Name: Jennifer Carney MRN: [id number] Date of Birth: [birth year] Referring Provider:  Geoffery Lyons, [place]  Encounter Date: 04/09/2015      PT End of Session - 04/09/15 1917    Visit Number 18   Number of Visits 24   Date for PT Re-Evaluation 04/23/15   PT Start Time 1825   PT Stop Time 1920   PT Time Calculation (min) 55 min   Activity Tolerance Patient tolerated treatment well   Behavior During Therapy [facility] for tasks assessed/performed      History reviewed. No pertinent past medical history.  History reviewed. No pertinent past surgical history.  There were no vitals filed for this visit.  Visit Diagnosis:  Right shoulder pain  Cervicalgia  Muscle weakness      Subjective Assessment - 04/09/15 1844    Subjective Patient reports she continues with pain in right side of neck and upper trapezius muscle. She still cannot sleep on right side and has difficutly with performing tasks at work and home. She is seeing good results with physical therapy intervention   Patient Stated Goals to get rid of pain and be able to use right UE again normally without pain/difficulty   Currently in Pain? Yes   Pain Score 3    Pain Location Other (Comment)  right shoulder int upper trapezius muscle   Pain Orientation Right   Pain Descriptors / Indicators Aching;Tightness   Pain Type Chronic pain   Pain Onset More than a month ago   Pain Frequency Intermittent      Objective:  Palpation: increased spasms along right cervical spine and upper trapezius       [facility] Adult PT Treatment/Exercise - 04/09/15 1846    Exercises   Exercises Other Exercises   Other Exercises   seated scapular rows with both UE's 10#, single arm 5# x 15 reps each, standing lat pull downs with 15# x 15 reps,  reverse chin ups with 15# in sitting position x 15 reps, body blade added 2 sets x 15 seconds with small and large blade with demonstration and verbal cuing,    Modalities   Modalities Electrical Stimulation;Cryotherapy          Cryotherapy   Number Minutes Cryotherapy 20 Minutes   Cryotherapy Location Shoulder   Type of Cryotherapy Ice pack   Electrical Stimulation   Electrical Stimulation Location right shoulder    Electrical Stimulation Parameters high volt (2) electrodes applied to anterio and upper trapezius muscle, russian stim. with electrodes placed along medial aspect of scapula 10/10 cycle    Electrical Stimulation Goals Pain;Neuromuscular facilitation          Manual Therapy   Manual Therapy Soft tissue mobilization;   Soft tissue mobilization right shoulder upper trapezius STM and  release and stretchingg          Patient response to treatment: improved control with scapular rows and verbal cues for correct alignment of shoulder during exercises, decreased spasms right cervical spine paraspinal muscles and decreased tenderness and pain in right shoulder/cervical spine with estim.          PT Education - 04/09/15 1915    Education provided Yes   Education Details written instructions given for home scapular adduction and lat pull downs, body blade stabilization exercises performed with demonstration and  verbal cuing   Person(s) Educated Patient   Methods Explanation;Demonstration;Handout;Verbal cues   Comprehension Verbalized understanding;Returned demonstration             PT Long Term Goals - 03/19/15 1930    PT LONG TERM GOAL #1   Title Patient will improve quickDash to 45% or better demonstrating imrpoved UE function with decreased pain by 02/26/2015   Baseline 59% score   Status Achieved   PT LONG TERM GOAL #2   Title Patient will be able to reach back and overhead with right UE with minimal pain to improe personal care and work related activities  by  02/26/2015 continue with goal : 04/23/2015   Baseline unable to reach back without increased pain limiting movement   Status Revised   PT LONG TERM GOAL #3   Title QuickDAsh score 30% or less by 03/20/2015 indicating improved self perceived disablity with functional use right UE    Baseline 59% score   Status Achieved   PT LONG TERM GOAL #4   Title Patient will be independent with home program for self management of pain and exercises by 04/23/2015   Baseline patient has limited knowledge of appropriate pain control/progressive exercises for right shoulder pain   Status Revised   PT LONG TERM GOAL #5   Title Patient will improve quickDash scores to <10% demonstrating improved functional use right UE with minimal right shoulder pain and stiffness by 04/23/2015   Baseline quick Dash score 25% for work, 16% daily tasks   Status Revised               Plan - 04/09/15 1921    Clinical Impression Statement Patient demonstrates decreasing spasms. She is progressing with strengthening and scapular control exercises and is able to perform daily tasks with less difficulty with using right UE. She continues to require guidance and verbal cuing for exercises and manual therapy and modalities for pain control and to decrease spasms and will therefore benefit from additional physical therapy intervention.    Pt will benefit from skilled therapeutic intervention in order to improve on the following deficits Increased muscle spasms;Decreased range of motion;Pain;Impaired UE functional use   Rehab Potential Good   PT Frequency 2x / week   PT Duration 6 weeks   PT Treatment/Interventions Manual techniques;Cryotherapy;Electrical Stimulation;Therapeutic exercise;Ultrasound;Patient/family education   PT Next Visit Plan pain control, ther. ex, manual  therapy techniques         Problem List There are no active problems to display for this patient.   Beacher May PT 04/09/2015, 7:29 PM  [facility] PHYSICAL [facility] SPORTS MEDICINE [address], [place], [address] Phone: [phone]   Fax:  [phone]

## 2015-04-14 ENCOUNTER — Encounter: Payer: Self-pay | Admitting: Physical Therapy

## 2015-04-14 ENCOUNTER — Ambulatory Visit: Payer: Managed Care, Other (non HMO) | Admitting: Physical Therapy

## 2015-04-14 DIAGNOSIS — M6281 Muscle weakness (generalized): Secondary | ICD-10-CM

## 2015-04-14 DIAGNOSIS — M542 Cervicalgia: Secondary | ICD-10-CM

## 2015-04-14 DIAGNOSIS — M25511 Pain in right shoulder: Secondary | ICD-10-CM

## 2015-04-14 NOTE — Therapy (Signed)
[place] [facility] PHYSICAL [facility] SPORTS MEDICINE [address] [place], [address] Phone: [phone]   Fax:  [phone]  Physical Therapy Treatment  Patient Details  Name: Jennifer Carney MRN: [id number] Date of Birth: 1981/05/08 Referring Provider:  Geoffery Lyons, [place]  Encounter Date: 04/14/2015      PT End of Session - 04/14/15 1915    Visit Number 19   Number of Visits 24   Date for PT Re-Evaluation 04/23/15   PT Start Time 1835   PT Stop Time 1923   PT Time Calculation (min) 48 min   Activity Tolerance Patient tolerated treatment well   Behavior During Therapy [facility] for tasks assessed/performed      History reviewed. No pertinent past medical history.  History reviewed. No pertinent past surgical history.  There were no vitals filed for this visit.  Visit Diagnosis:  Right shoulder pain  Cervicalgia  Muscle weakness      Subjective Assessment - 04/14/15 1834    Subjective Patient reports she is feeling a great deal better and feels the soft tissue massage has made a great difference since last session. Today she is feeling no pain and is tender over right upper trapezius with tight band and TP. She still is having some difficulty with sleeping on right side.    Patient Stated Goals to get rid of pain and be able to use right UE again normally without pain/difficulty   Currently in Pain? Yes   Pain Score 2    Pain Location Other (Comment)  right shoulder/upper trapezius   Pain Orientation Right   Pain Descriptors / Indicators Aching;Tightness   Pain Type Chronic pain   Pain Onset More than a month ago   Pain Frequency Intermittent       Objective: Palpation: right upper trapezius and along cervical spine paraspinal muscles increased spasms (~50% decreased from previous session) AROM: right shoulder forward elevation WNL, ER and IR WFL's with mild discomfort with IR behind back Posture: mild hiking right UE, rounded  shoulders, mild forward head posture      [facility] Adult PT Treatment/Exercise - 04/14/15 1837    Exercises   Exercises Other Exercises   Other Exercises  Reassessed home exercises with patient verbally today due to patient feeling tired. She is exercising consistently at home and is seeing good results with improving strength.   Modalities   Modalities Electrical Stimulation;Cryotherapy          Cryotherapy   Cryotherapy Location Shoulder: right with patient seated    Electrical Stimulation   Electrical Stimulation Location right shoulder : high volt estim. Continuous for muscle spasm applied to upper trapezius and along lower cervical spine, russian stim. Applied 10/10 cycle to medial border of right scapula with patient seated   Electrical Stimulation Goals Pain;Neuromuscular facilitation          Manual Therapy   Manual Therapy Soft tissue mobilization;Myofascial release   Soft tissue mobilization right shoulder upper trapezius release and stretching   Myofascial Release suboccipital release with patient supine lying with ROM rotation/lateral flexion bilaterally x 3 reps each     Patient response to treatment: improved soft tissue elasticity and decreased TP tenderness and thickness and improved cervical spine rotations and lateral flexion, verbalized good understanding of home exercises for strengthening and flexibility         PT Long Term Goals - 03/19/15 1930    PT LONG TERM GOAL #1   Title Patient will improve  quickDash to 45% or better demonstrating imrpoved UE function with decreased pain by 02/26/2015   Baseline 59% score   Status Achieved   PT LONG TERM GOAL #2   Title Patient will be able to reach back and overhead with right UE with minimal pain to improe personal care and work related activities  by 02/26/2015 continue with goal : 04/23/2015   Baseline unable to reach back without increased pain limiting movement   Status Revised   PT LONG TERM GOAL #3   Title  QuickDAsh score 30% or less by 03/20/2015 indicating improved self perceived disablity with functional use right UE    Baseline 59% score   Status Achieved   PT LONG TERM GOAL #4   Title Patient will be independent with home program for self management of pain and exercises by 04/23/2015   Baseline patient has limited knowledge of appropriate pain control/progressive exercises for right shoulder pain   Status Revised   PT LONG TERM GOAL #5   Title Patient will improve quickDash scores to <10% demonstrating improved functional use right UE with minimal right shoulder pain and stiffness by 04/23/2015   Baseline quick Dash score 25% for work, 16% daily tasks   Status Revised               Plan - 04/14/15 1915    Clinical Impression Statement Patient demonstrates decreased spasms in right side cervical spine and uper trapezius muscles. She continues with strengthening and motor control right UE and shoulder stabilization.    Pt will benefit from skilled therapeutic intervention in order to improve on the following deficits Increased muscle spasms;Decreased range of motion;Pain;Impaired UE functional use   Rehab Potential Good   PT Frequency 2x / week   PT Duration 6 weeks   PT Treatment/Interventions Manual techniques;Cryotherapy;Electrical Stimulation;Therapeutic exercise;Ultrasound;Patient/family education   PT Next Visit Plan pain control, ther. ex, manual  therapy techniques    PT Home Exercise Plan scapular control, posture correction for right shoulder/cervical spine        Problem List There are no active problems to display for this patient.   Beacher May PT 04/14/2015, 11:13 PM  [place] [facility] PHYSICAL AND SPORTS MEDICINE [address] [place], [address] Phone: [phone]   Fax:  [phone]

## 2015-04-16 ENCOUNTER — Ambulatory Visit: Payer: Managed Care, Other (non HMO) | Admitting: Physical Therapy

## 2015-04-16 ENCOUNTER — Encounter: Payer: Self-pay | Admitting: Physical Therapy

## 2015-04-16 DIAGNOSIS — M25511 Pain in right shoulder: Secondary | ICD-10-CM | POA: Diagnosis not present

## 2015-04-16 DIAGNOSIS — M6281 Muscle weakness (generalized): Secondary | ICD-10-CM

## 2015-04-16 DIAGNOSIS — M542 Cervicalgia: Secondary | ICD-10-CM

## 2015-04-16 NOTE — Therapy (Signed)
[facility] [address], [place], [address] Phone: [phone]   Fax:  [phone]  Physical Therapy Treatment  Patient Details  Name: Jennifer Carney MRN: [id number] Date of Birth: 04-17-1981 Referring Provider:  Geoffery Lyons, [place]  Encounter Date: 04/16/2015      PT End of Session - 04/16/15 1945    Visit Number 20   Number of Visits 24   Date for PT Re-Evaluation 04/23/15   PT Start Time 1840   PT Stop Time 1930   PT Time Calculation (min) 50 min   Activity Tolerance Patient tolerated treatment well   Behavior During Therapy [facility] for tasks assessed/performed      History reviewed. No pertinent past medical history.  History reviewed. No pertinent past surgical history.  There were no vitals filed for this visit.  Visit Diagnosis:  Right shoulder pain  Muscle weakness  Cervicalgia      Subjective Assessment - 04/16/15 1843    Subjective Patient reports she continues to see improvement and feels the soft tissue massage has made a great difference since last session. Today she is feeling mild soreness right shoulder/neck region and is tender over right upper trapezius with tight band and TP. She still is having some difficulty with sleeping on right side.    Patient Stated Goals to get rid of pain and be able to use right UE again normally without pain/difficulty   Currently in Pain? Yes   Pain Score 2    Pain Location Other (Comment)  right shoulder/upper trapezius   Pain Orientation Right   Pain Descriptors / Indicators Aching;Tightness   Pain Type Chronic pain   Pain Onset More than a month ago   Pain Frequency Intermittent             [facility] Adult PT Treatment/Exercise - 04/16/15 2148    Exercises   Exercises Other Exercises   Other Exercises  Omega cable exercises: seated scapuar rows with 15# x 15 reps, lat pull downs with 15# x 15 reps, seated reverse chin ups x 15 reps,  Supine  lying: stretching upper trapezius 3 reps 20 second holds, rotations x 3 reps each direction, isometric rotations x 3 reps 10 second holds, cervical spine extension x 5 reps with manual resistance 5 second holds   Modalities   Modalities Electrical Stimulation;Cryotherapy   Moist Heat Therapy   Moist Heat Location --   Cryotherapy   Number Minutes Cryotherapy 20 Minutes   Cryotherapy Location Shoulder   Type of Cryotherapy Ice pack   Electrical Stimulation   Electrical Stimulation Location right shoulder    Electrical Stimulation Parameters high volt (2) electrodes applied to anterior and upper trapezius muscles, Russian stim. electrodes placed along medial aspect of scapula and inferior border with 10/10 cycle, intensity to tolerance, contraction   Electrical Stimulation Goals Pain;Neuromuscular facilitation   Ultrasound   Ultrasound Goals Pain   Manual Therapy   Manual Therapy Soft tissue mobilization;Myofascial release   Soft tissue mobilization right shoulder upper trapezius release and stretchingg   Myofascial Release suboccipital release with patient supine lying      Patient response to treatment: improved soft tissue elasticity and decreased TP tenderness and thickness and improved cervical spine rotations and lateral flexion, verbalized good understanding of home exercises for strengthening and flexibility, improved ability to perform exercise with minimal cuing today               PT Education -  04/16/15 1940    Education provided Yes   Education Details Instructed to continue with exercises as instructed, verbal cuing required for exercises for scapular control   Person(s) Educated Patient   Methods Explanation;Demonstration;Verbal cues   Comprehension Verbalized understanding;Returned demonstration             PT Long Term Goals - 03/19/15 1930    PT LONG TERM GOAL #1   Title Patient will improve quickDash to 45% or better demonstrating imrpoved UE  function with decreased pain by 02/26/2015   Baseline 59% score   Status Achieved   PT LONG TERM GOAL #2   Title Patient will be able to reach back and overhead with right UE with minimal pain to improe personal care and work related activities  by 02/26/2015 continue with goal : 04/23/2015   Baseline unable to reach back without increased pain limiting movement   Status Revised   PT LONG TERM GOAL #3   Title QuickDAsh score 30% or less by 03/20/2015 indicating improved self perceived disablity with functional use right UE    Baseline 59% score   Status Achieved   PT LONG TERM GOAL #4   Title Patient will be independent with home program for self management of pain and exercises by 04/23/2015   Baseline patient has limited knowledge of appropriate pain control/progressive exercises for right shoulder pain   Status Revised   PT LONG TERM GOAL #5   Title Patient will improve quickDash scores to <10% demonstrating improved functional use right UE with minimal right shoulder pain and stiffness by 04/23/2015   Baseline quick Dash score 25% for work, 16% daily tasks   Status Revised               Plan - 04/16/15 1940    Clinical Impression Statement Patient is porgressing well towards all goals. She demonstrated decreased spasms and pain with treatment.    Pt will benefit from skilled therapeutic intervention in order to improve on the following deficits Increased muscle spasms;Decreased range of motion;Pain;Impaired UE functional use   Rehab Potential Good   PT Frequency 2x / week   PT Duration 6 weeks   PT Treatment/Interventions Manual techniques;Cryotherapy;Electrical Stimulation;Therapeutic exercise;Ultrasound;Patient/family education   PT Next Visit Plan pain control, ther. ex, manual  therapy techniques         Problem List There are no active problems to display for this patient.   Carl Best 04/16/2015, 10:12 PM  [facility] [address] [place], [address] Phone: [phone]   Fax:  [phone]

## 2015-04-21 ENCOUNTER — Ambulatory Visit: Payer: Managed Care, Other (non HMO) | Admitting: Physical Therapy

## 2015-04-23 ENCOUNTER — Encounter: Payer: Self-pay | Admitting: Physical Therapy

## 2015-04-23 ENCOUNTER — Ambulatory Visit: Payer: Managed Care, Other (non HMO) | Admitting: Physical Therapy

## 2015-04-23 DIAGNOSIS — M25511 Pain in right shoulder: Secondary | ICD-10-CM | POA: Diagnosis not present

## 2015-04-23 DIAGNOSIS — M542 Cervicalgia: Secondary | ICD-10-CM

## 2015-04-23 DIAGNOSIS — M6281 Muscle weakness (generalized): Secondary | ICD-10-CM

## 2015-04-24 NOTE — Therapy (Signed)
[place] [facility] [address], [place], [address] Phone: [phone]   Fax:  [phone]  Physical Therapy Treatment/Discharge Summary  Patient Details  Name: Jennifer Carney MRN: [id number] Date of Birth: 1981-07-13 Referring Provider:  Geoffery Lyons, [place]  Encounter Date: 04/23/2015   Patient began physical therapy 02/06/2015  Through 04/23/2015 and has attended 21 sessions  Current status and outcome measures:  Plan: discharge from physical therapy with goals achieved      PT End of Session - 04/23/15 1900    Visit Number 21   Number of Visits 24   Date for PT Re-Evaluation 04/23/15   PT Start Time 1835   PT Stop Time 1900   PT Time Calculation (min) 25 min   Activity Tolerance Patient tolerated treatment well   Behavior During Therapy [facility] for tasks assessed/performed      History reviewed. No pertinent past medical history.  History reviewed. No pertinent past surgical history.  There were no vitals filed for this visit.  Visit Diagnosis:  Right shoulder pain  Muscle weakness  Cervicalgia      Subjective Assessment - 04/23/15 1840    Subjective Patient reports she is feeling a great deal better and feels the soft tissue massage has made a great difference since last session. Today she is feeling no pain and is tender over right upper trapezius with tight band and TP. She still is having some difficulty with sleeping on right side. Patient agrees to discharge to independent home program and self management of symptoms.    Patient Stated Goals to get rid of pain and be able to use right UE again normally without pain/difficulty   Currently in Pain? Yes   Pain Score 2    Pain Location Neck   Pain Orientation Right   Pain Descriptors / Indicators Aching   Pain Type Chronic pain   Pain Onset More than a month ago   Pain Frequency Intermittent     Objective:  Outcome measure: quickDash 7%, NDI 18%  (mild self perceived disabilty) AROM and strength: WNL's UE and cervical spine Palpation; continues with spasms right side cervical spine and upper trapezius muscles and may benefit from therapeutic massage as needed        [facility] Adult PT Treatment/Exercise - 04/23/15 1923    Exercises   Exercises Other Exercises   Other Exercises  Verbally reviewed home exercises: seated scapuar rows with 15# x 15 reps, lat pull downs with 15# x 15 reps, seated reverse chin ups x 15 reps,  Supine lying: stretching upper trapezius 3 reps 20 second holds, rotations x 3 reps each direction                                      Manual Therapy Soft tissue mobilization;Myofascial release   Soft tissue mobilization right shoulder upper trapezius release and stretchingg   Myofascial Release suboccipital release with patient supine lying                PT Education - 04/23/15 1850    Education provided Yes   Education Details re assesssed home exercises and verbalized understanding by patient   Person(s) Educated Patient   Methods Explanation   Comprehension Verbalized understanding             PT Long Term Goals - 04/23/15 1900    PT  LONG TERM GOAL #1   Title Patient will improve quickDash to 45% or better demonstrating imrpoved UE function with decreased pain by 02/26/2015   Baseline 59% score   Status Achieved   PT LONG TERM GOAL #2   Title Patient will be able to reach back and overhead with right UE with minimal pain to improe personal care and work related activities  by 02/26/2015 continue with goal : 04/23/2015   Baseline unable to reach back without increased pain limiting movement   Status Achieved   PT LONG TERM GOAL #3   Title QuickDAsh score 30% or less by 03/20/2015 indicating improved self perceived disablity with functional use right UE    Baseline 59% score   Status Achieved   PT LONG TERM GOAL #5   Title Patient will improve quickDash scores to <10% demonstrating  improved functional use right UE with minimal right shoulder pain and stiffness by 04/23/2015   Baseline quick Dash score 25% for work, 16% daily tasks   Status Achieved               Plan - 04/23/15 1900    Clinical Impression Statement Patient has achieved all goals and has good understanding of home program. Discharge from physical therapy at this time.    Rehab Potential Good   PT Frequency 2x / week   PT Duration 6 weeks   PT Treatment/Interventions Manual techniques;Cryotherapy;Electrical Stimulation;Therapeutic exercise;Ultrasound;Patient/family education        Problem List There are no active problems to display for this patient.   Beacher May PT 04/24/2015, 4:23 PM  [facility] SPORTS MEDICINE [address], [place], [address] Phone: [phone]   Fax:  [phone]

## 2015-04-29 ENCOUNTER — Encounter: Payer: Managed Care, Other (non HMO) | Admitting: Physical Therapy

## 2017-03-07 ENCOUNTER — Other Ambulatory Visit: Payer: Self-pay | Admitting: Nurse Practitioner

## 2017-03-07 DIAGNOSIS — M542 Cervicalgia: Secondary | ICD-10-CM

## 2017-03-07 DIAGNOSIS — M25511 Pain in right shoulder: Secondary | ICD-10-CM

## 2017-03-10 ENCOUNTER — Ambulatory Visit
Admission: RE | Admit: 2017-03-10 | Discharge: 2017-03-10 | Disposition: A | Discharge status: Home / Self Care | Payer: Commercial Managed Care - PPO | Source: Ambulatory Visit | Attending: Nurse Practitioner | Admitting: Nurse Practitioner

## 2017-03-10 ENCOUNTER — Encounter: Payer: Self-pay | Admitting: Radiology

## 2017-03-10 DIAGNOSIS — M542 Cervicalgia: Secondary | ICD-10-CM | POA: Diagnosis not present

## 2017-03-10 DIAGNOSIS — M25811 Other specified joint disorders, right shoulder: Secondary | ICD-10-CM | POA: Insufficient documentation

## 2017-03-10 DIAGNOSIS — M7551 Bursitis of right shoulder: Secondary | ICD-10-CM | POA: Insufficient documentation

## 2017-03-10 DIAGNOSIS — M19011 Primary osteoarthritis, right shoulder: Secondary | ICD-10-CM | POA: Diagnosis not present

## 2017-03-10 DIAGNOSIS — M25511 Pain in right shoulder: Secondary | ICD-10-CM | POA: Diagnosis present

## 2018-05-16 ENCOUNTER — Telehealth: Payer: Self-pay

## 2018-05-16 MED ORDER — SERTRALINE HCL 50 MG PO TABS: 50.0000 mg | ORAL_TABLET | Freq: Every day | ORAL | 0 refills | Status: DC

## 2018-05-16 NOTE — Telephone Encounter (Signed)
[facility] PT NEED APPT FOR FURTHER REFILLS  

## 2018-08-27 ENCOUNTER — Ambulatory Visit (INDEPENDENT_AMBULATORY_CARE_PROVIDER_SITE_OTHER): Payer: Commercial Managed Care - PPO | Admitting: Adult Health

## 2018-08-27 ENCOUNTER — Encounter: Payer: Self-pay | Admitting: Adult Health

## 2018-08-27 VITALS — BP 109/72 | HR 79 | Resp 16 | Ht 63.0 in | Wt 189.8 lb

## 2018-08-27 DIAGNOSIS — F17219 Nicotine dependence, cigarettes, with unspecified nicotine-induced disorders: Secondary | ICD-10-CM

## 2018-08-27 DIAGNOSIS — R5383 Other fatigue: Secondary | ICD-10-CM

## 2018-08-27 DIAGNOSIS — M25511 Pain in right shoulder: Secondary | ICD-10-CM

## 2018-08-27 DIAGNOSIS — F339 Major depressive disorder, recurrent, unspecified: Secondary | ICD-10-CM

## 2018-08-27 DIAGNOSIS — Z0001 Encounter for general adult medical examination with abnormal findings: Secondary | ICD-10-CM

## 2018-08-27 DIAGNOSIS — G8929 Other chronic pain: Secondary | ICD-10-CM

## 2018-08-27 DIAGNOSIS — N939 Abnormal uterine and vaginal bleeding, unspecified: Secondary | ICD-10-CM | POA: Diagnosis not present

## 2018-08-27 DIAGNOSIS — R300 Dysuria: Secondary | ICD-10-CM

## 2018-08-27 MED ORDER — SERTRALINE HCL 50 MG PO TABS: 50.0000 mg | ORAL_TABLET | Freq: Every day | ORAL | 4 refills | Status: AC

## 2018-08-27 MED ORDER — CYCLOBENZAPRINE HCL 10 MG PO TABS: 10.0000 mg | ORAL_TABLET | Freq: Three times a day (TID) | ORAL | 0 refills | Status: AC | PRN

## 2018-08-27 NOTE — Progress Notes (Signed)
[facility] [address] [place], [place] [id number]  Internal MEDICINE  Office Visit Note  Patient Name: Jennifer Carney  [id number]  [id number]  Date of Service: 08/27/2018  Chief Complaint  Patient presents with  . Annual Exam    PATIENT WAS REFERRED TO GYN, PUT ON BC,HYSTERECTOMY NEEDED BUT UNABLE TO GET SURGERY, NEEDS REFERRAL/ LAST PAP IN 2017     HPI Pt is here for routine health maintenance examination.  She is a well-appearing 37 year old female.  Overall she is generally doing well.  She does need some refills on medications at this visit.  She intermittently has neck and shoulder pain that she has taken muscle relaxers for in the past.  She would like a refill on these at this time.  She denies any alcohol, or illicit drug use.  She does report smoking about half a pack of cigarettes daily.  She has been trying to cut down.  She also reports that this visit that she was taking Zoloft regularly however ran out of her medications 3 months ago and thought she could do without them.  She reports she has noticed increased agitation and simple things bothering her.  She would like to restart her Zoloft at this time.  Current Medication: Outpatient Encounter Medications as of 08/27/2018  Medication Sig  . cephALEXin (KEFLEX) 500 MG capsule Take 1 capsule (500 mg total) by mouth 3 (three) times daily. (Patient not taking: Reported on 08/27/2018)  . sertraline (ZOLOFT) 50 MG tablet Take 1 tablet (50 mg total) by mouth daily. (Patient not taking: Reported on 08/27/2018)   No facility-administered encounter medications on file as of 08/27/2018.     Surgical History: History reviewed. No pertinent surgical history.  Medical History: History reviewed. No pertinent past medical history.  Family History: Family History  Problem Relation Age of Onset  . Depression Mother   . Arthritis Mother   . Diabetes Father       Review of Systems  Constitutional: Negative for  chills, fatigue and unexpected weight change.  HENT: Negative for congestion, rhinorrhea, sneezing and sore throat.   Eyes: Negative for photophobia, pain and redness.  Respiratory: Negative for cough, chest tightness and shortness of breath.   Cardiovascular: Negative for chest pain and palpitations.  Gastrointestinal: Negative for abdominal pain, constipation, diarrhea, nausea and vomiting.  Endocrine: Negative.   Genitourinary: Negative for dysuria and frequency.  Musculoskeletal: Negative for arthralgias, back pain, joint swelling and neck pain.  Skin: Negative for rash.  Allergic/Immunologic: Negative.   Neurological: Negative for tremors and numbness.  Hematological: Negative for adenopathy. Does not bruise/bleed easily.  Psychiatric/Behavioral: Negative for behavioral problems and sleep disturbance. The patient is nervous/anxious.      Vital Signs: BP 109/72   Pulse 79   Resp 16   Ht 5\' 3"  (1.6 m)   Wt 189 lb 12.8 oz (86.1 kg)   SpO2 98%   BMI 33.62 kg/m    Physical Exam Vitals signs and nursing note reviewed.  Constitutional:      General: She is not in acute distress.    Appearance: She is well-developed. She is not diaphoretic.  HENT:     Head: Normocephalic and atraumatic.     Mouth/Throat:     Pharynx: No oropharyngeal exudate.  Eyes:     Pupils: Pupils are equal, round, and reactive to light.  Neck:     Musculoskeletal: Normal range of motion and neck supple.     Thyroid: No thyromegaly.  Vascular: No JVD.     Trachea: No tracheal deviation.  Cardiovascular:     Rate and Rhythm: Normal rate and regular rhythm.     Heart sounds: Normal heart sounds. No murmur. No friction rub. No gallop.   Pulmonary:     Effort: Pulmonary effort is normal. No respiratory distress.     Breath sounds: Normal breath sounds. No wheezing or rales.  Chest:     Chest wall: No tenderness.     Breasts:        Right: Normal. No swelling, bleeding, inverted nipple, mass,  nipple discharge, skin change or tenderness.        Left: Normal. No swelling, bleeding, inverted nipple, mass, nipple discharge, skin change or tenderness.     Comments: Exam Chaperoned by Golda AcreNimisha Patel CMA  Abdominal:     Palpations: Abdomen is soft.     Tenderness: There is no abdominal tenderness. There is no guarding.  Musculoskeletal: Normal range of motion.  Lymphadenopathy:     Cervical: No cervical adenopathy.  Skin:    General: Skin is warm and dry.  Neurological:     Mental Status: She is alert and oriented to person, place, and time.     Cranial Nerves: No cranial nerve deficit.  Psychiatric:        Behavior: Behavior normal.        Thought Content: Thought content normal.        Judgment: Judgment normal.      LABS: No results found for this or any previous visit (from the past 2160 hour(s)).   Assessment/Plan: 1. Encounter for general adult medical examination with abnormal findings Patient is up-to-date on preventative health maintenance.- CBC with Differential/Platelet - Lipid Panel With LDL/HDL Ratio - TSH - T4, free - Comprehensive metabolic panel  2. Menstrual bleeding problem During history and physical patient reports some irregular menstrual bleeding.  She states that she had seen gynecology in the past however has not been in quite some time.  She would like that referral.  That referral is placed at this time. - Ambulatory referral to Gynecology  3. Fatigue, unspecified type Patient reports a moderate amount of fatigue will check labs for evaluation. - Vitamin D 1,25 dihydroxy - B12 and Folate Panel - Fe+TIBC+Fer  4. Cigarette nicotine dependence with nicotine-induced disorder Smoking cessation counseling: 1. Pt acknowledges the risks of long term smoking, she will try to quite smoking. 2. Options for different medications including nicotine products, chewing gum, patch etc, Wellbutrin and Chantix is discussed 3. Goal and date of compete  cessation is discussed 4. Total time spent in smoking cessation is 15 min.   5. Chronic right shoulder pain Refill patient's Flexeril prescription for flareups of her chronic right shoulder pain. - cyclobenzaprine (FLEXERIL) 10 MG tablet; Take 1 tablet (10 mg total) by mouth 3 (three) times daily as needed for muscle spasms.  Dispense: 30 tablet; Refill: 0  6. Depression, recurrent (HCC) Restarted patient's Zoloft.  Discussed that she would not see full effect for 6 to 8 weeks and that she should not stop the medication by herself.  This medication will need to be weaned for the least amount of side effects.  She verbalized understanding and will follow-up in a few months. - sertraline (ZOLOFT) 50 MG tablet; Take 1 tablet (50 mg total) by mouth daily.  Dispense: 30 tablet; Refill: 4  7. Dysuria - UA/M w/rflx Culture, Routine - Microscopic Examination - Urine Culture, Reflex  General Counseling: Rosey Batheresa  verbalizes understanding of the findings of todays visit and agrees with plan of treatment. I have discussed any further diagnostic evaluation that may be needed or ordered today. We also reviewed her medications today. she has been encouraged to call the office with any questions or concerns that should arise related to todays visit.   Orders Placed This Encounter  Procedures  . UA/M w/rflx Culture, Routine    No orders of the defined types were placed in this encounter.   Time spent: 25 Minutes   This patient was seen by Blima Ledger AGNP-C in Collaboration with Dr Lyndon Code as a part of collaborative care agreement    Johnna Acosta AGNP-C Internal Medicine

## 2018-08-27 NOTE — Progress Notes (Signed)
ur

## 2018-08-27 NOTE — Patient Instructions (Signed)
Coping with Quitting Smoking  Quitting smoking is a physical and mental challenge. You will face cravings, withdrawal symptoms, and temptation. Before quitting, work with your health care provider to make a plan that can help you cope. Preparation can help you quit and keep you from giving in. How can I cope with cravings? Cravings usually last for 5-10 minutes. If you get through it, the craving will pass. Consider taking the following actions to help you cope with cravings:  Keep your mouth busy: ? Chew sugar-free gum. ? Suck on hard candies or a straw. ? Brush your teeth.  Keep your hands and body busy: ? Immediately change to a different activity when you feel a craving. ? Squeeze or play with a ball. ? Do an activity or a hobby, like making bead jewelry, practicing needlepoint, or working with wood. ? Mix up your normal routine. ? Take a short exercise break. Go for a quick walk or run up and down stairs. ? Spend time in public places where smoking is not allowed.  Focus on doing something kind or helpful for someone else.  Call a friend or family member to talk during a craving.  Join a support group.  Call a quit line, such as 1-800-QUIT-NOW.  Talk with your health care provider about medicines that might help you cope with cravings and make quitting easier for you. How can I deal with withdrawal symptoms? Your body may experience negative effects as it tries to get used to not having nicotine in the system. These effects are called withdrawal symptoms. They may include:  Feeling hungrier than normal.  Trouble concentrating.  Irritability.  Trouble sleeping.  Feeling depressed.  Restlessness and agitation.  Craving a cigarette. To manage withdrawal symptoms:  Avoid places, people, and activities that trigger your cravings.  Remember why you want to quit.  Get plenty of sleep.  Avoid coffee and other caffeinated drinks. These may worsen some of your symptoms.  How can I handle social situations? Social situations can be difficult when you are quitting smoking, especially in the first few weeks. To manage this, you can:  Avoid parties, bars, and other social situations where people might be smoking.  Avoid alcohol.  Leave right away if you have the urge to smoke.  Explain to your family and friends that you are quitting smoking. Ask for understanding and support.  Plan activities with friends or family where smoking is not an option. What are some ways I can cope with stress? Wanting to smoke may cause stress, and stress can make you want to smoke. Find ways to manage your stress. Relaxation techniques can help. For example:  Breathe slowly and deeply, in through your nose and out through your mouth.  Listen to soothing, relaxing music.  Talk with a family member or friend about your stress.  Light a candle.  Soak in a bath or take a shower.  Think about a peaceful place. What are some ways I can prevent weight gain? Be aware that many people gain weight after they quit smoking. However, not everyone does. To keep from gaining weight, have a plan in place before you quit and stick to the plan after you quit. Your plan should include:  Having healthy snacks. When you have a craving, it may help to: ? Eat plain popcorn, crunchy carrots, celery, or other cut vegetables. ? Chew sugar-free gum.  Changing how you eat: ? Eat small portion sizes at meals. ? Eat 4-6 small meals   throughout the day instead of 1-2 large meals a day. ? Be mindful when you eat. Do not watch television or do other things that might distract you as you eat.  Exercising regularly: ? Make time to exercise each day. If you do not have time for a long workout, do short bouts of exercise for 5-10 minutes several times a day. ? Do some form of strengthening exercise, like weight lifting, and some form of aerobic exercise, like running or swimming.  Drinking plenty of  water or other low-calorie or no-calorie drinks. Drink 6-8 glasses of water daily, or as much as instructed by your health care provider. Summary  Quitting smoking is a physical and mental challenge. You will face cravings, withdrawal symptoms, and temptation to smoke again. Preparation can help you as you go through these challenges.  You can cope with cravings by keeping your mouth busy (such as by chewing gum), keeping your body and hands busy, and making calls to family, friends, or a helpline for people who want to quit smoking.  You can cope with withdrawal symptoms by avoiding places where people smoke, avoiding drinks with caffeine, and getting plenty of rest.  Ask your health care provider about the different ways to prevent weight gain, avoid stress, and handle social situations. This information is not intended to replace advice given to you by your health care provider. Make sure you discuss any questions you have with your health care provider. Document Released: 08/19/2016 Document Revised: 08/19/2016 Document Reviewed: 08/19/2016 Elsevier Interactive Patient Education  2019 [employer]  

## 2018-08-30 LAB — MICROSCOPIC EXAMINATION: CASTS: NONE SEEN /LPF

## 2018-08-30 LAB — UA/M W/RFLX CULTURE, ROUTINE
Bilirubin, UA: NEGATIVE
Glucose, UA: NEGATIVE
KETONES UA: NEGATIVE
Nitrite, UA: NEGATIVE
PH UA: 5.5 (ref 5.0–7.5)
Protein, UA: NEGATIVE
Specific Gravity, UA: 1.016 (ref 1.005–1.030)
UUROB: 0.2 mg/dL (ref 0.2–1.0)

## 2018-08-30 LAB — URINE CULTURE, REFLEX

## 2018-09-13 ENCOUNTER — Encounter: Payer: Self-pay | Admitting: Obstetrics and Gynecology

## 2018-10-16 ENCOUNTER — Telehealth: Payer: Self-pay | Admitting: Obstetrics & Gynecology

## 2018-10-16 NOTE — Telephone Encounter (Signed)
[facility] medical referring for Menstrual bleeding problem. Patient was schedule and cancel appointment because she couldn't find her her insurance card. I called today left voicemail for patient to call back to be schedule

## 2018-10-18 NOTE — Telephone Encounter (Signed)
Called and left voice mail for patient to call back to be schedule °

## 2018-10-22 NOTE — Telephone Encounter (Signed)
Called and left voice mail for patient to call back to be schedule °

## 2018-11-28 ENCOUNTER — Ambulatory Visit: Payer: Self-pay | Admitting: Adult Health

## 2019-06-20 IMAGING — MR MR CERVICAL SPINE W/O CM
5 series · 37 of 48 positions shown · non-contrast
Comparison: None.

CLINICAL DATA: Cervicalgia. Right-sided neck pain from base of
skull to right shoulder for 2 years.

EXAM:
MRI CERVICAL SPINE WITHOUT CONTRAST
TECHNIQUE: Multiplanar, multisequence MR imaging of the cervical spine was
performed. No intravenous contrast was administered.

[Series 3: T2 · sagittal · 3.0mm · 0.70mm/px · 8 of 17 slices shown (1 of 2)]
[im 1/17]
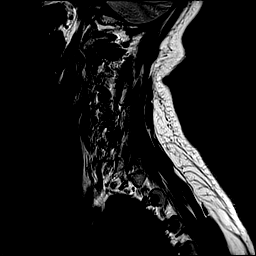
[im 3/17]
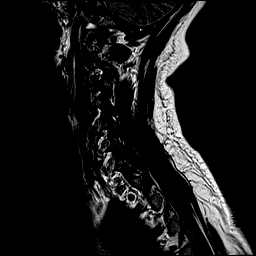
[im 5/17]
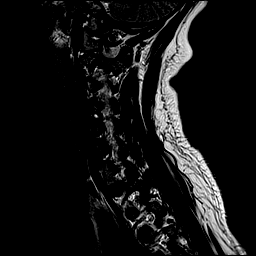
[im 7/17]
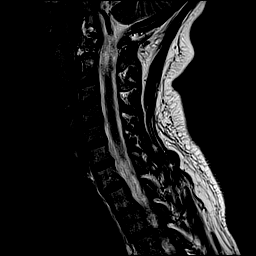
[im 10/17]
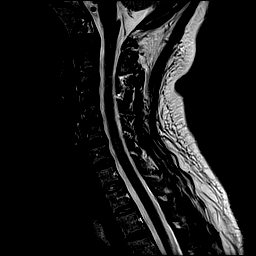
[im 12/17]
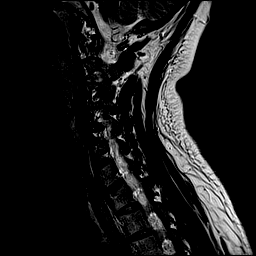
[im 14/17]
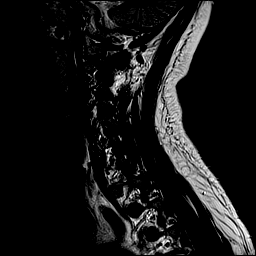
[im 17/17]
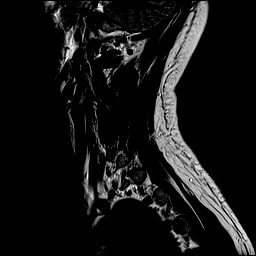

[Series 4: STIR · sagittal · 3.0mm · 0.35mm/px · 8 of 17 slices shown]
[im 1/17]
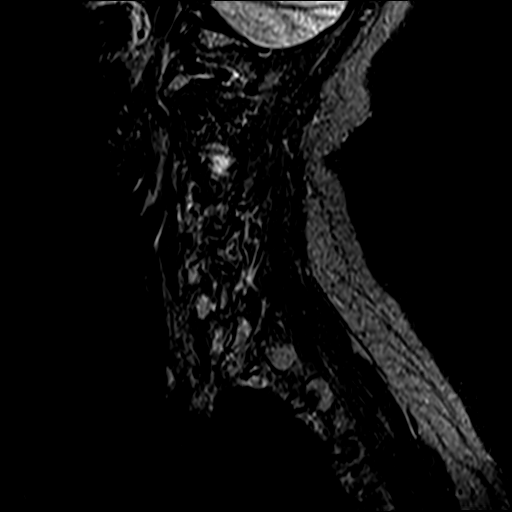
[im 3/17]
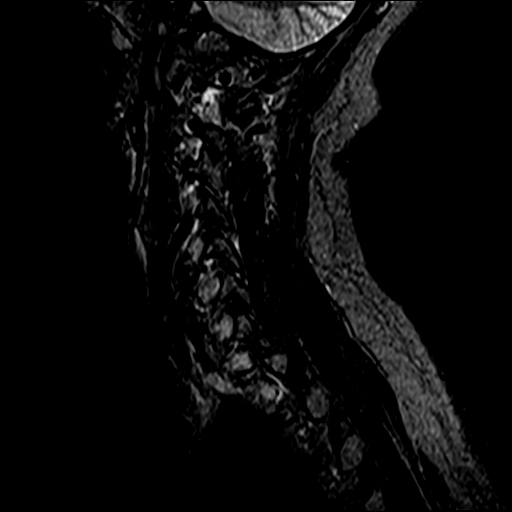
[im 5/17]
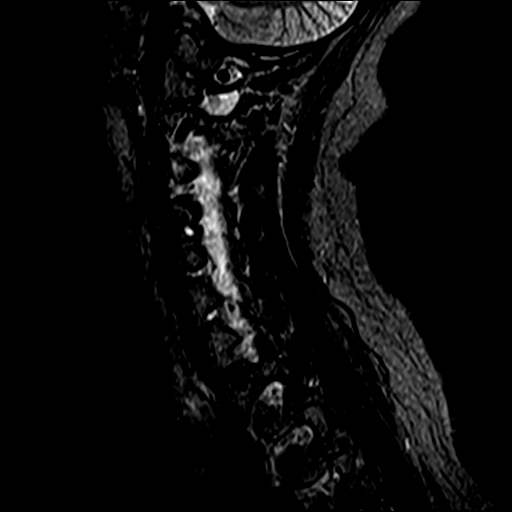
[im 7/17]
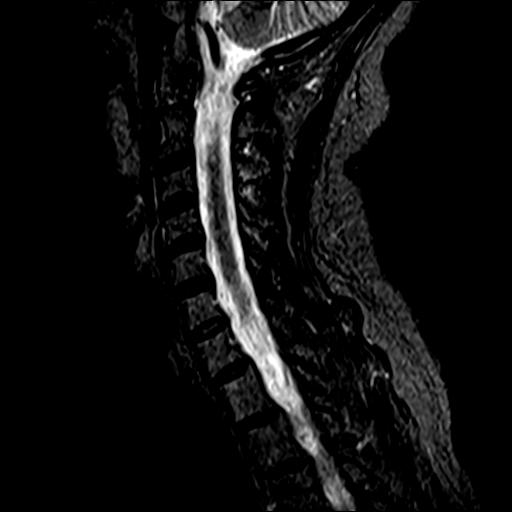
[im 10/17]
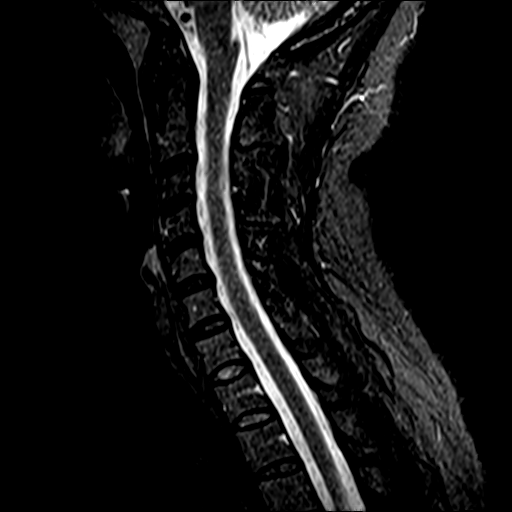
[im 12/17]
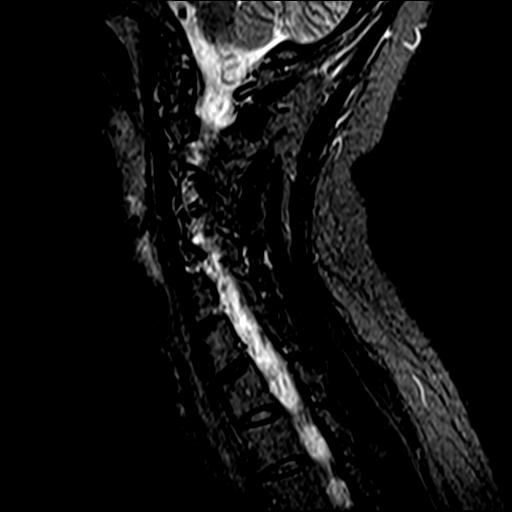
[im 14/17]
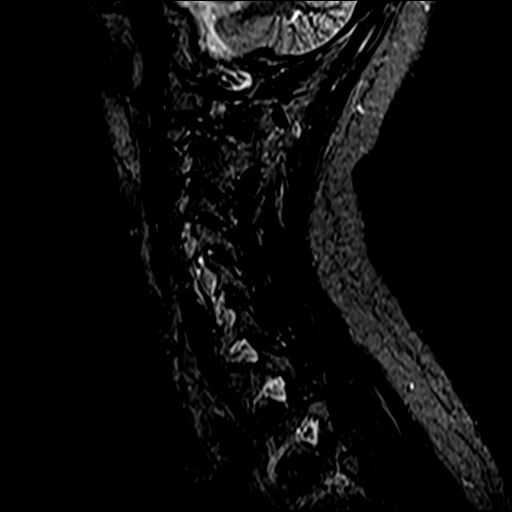
[im 17/17]
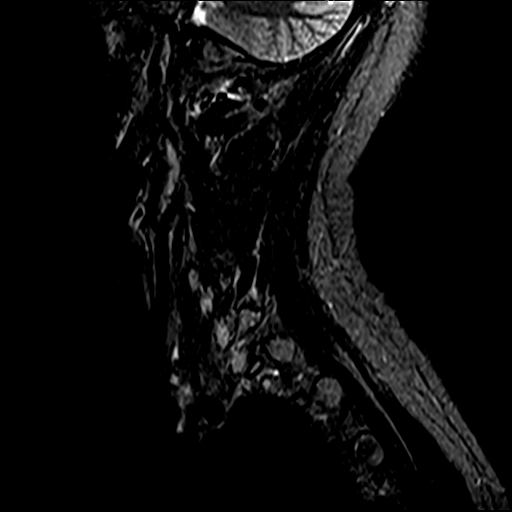

[Series 5: T1 · sagittal · 3.0mm · 0.70mm/px · 8 of 17 slices shown]
[im 1/17]
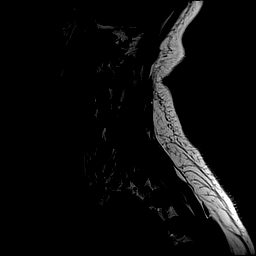
[im 3/17]
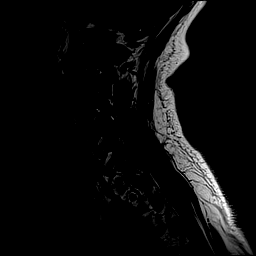
[im 5/17]
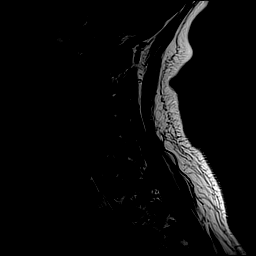
[im 7/17]
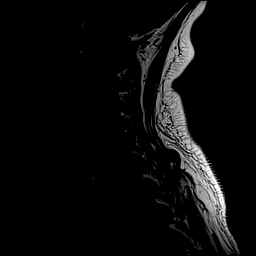
[im 10/17]
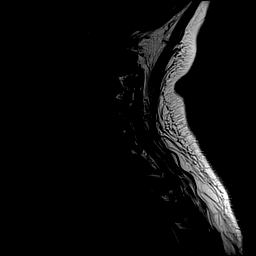
[im 12/17]
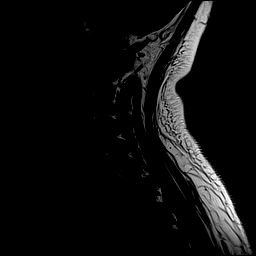
[im 14/17]
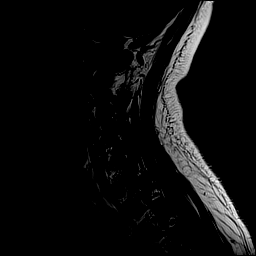
[im 17/17]
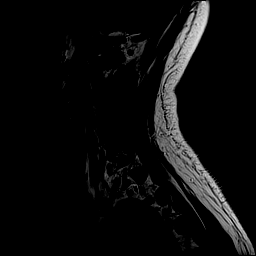

[Series 6: T2 · axial · 3.0mm · 0.70mm/px · z∈[-52,+38]mm · 9 of 25 slices shown (2 of 2)]
[im 1/25]
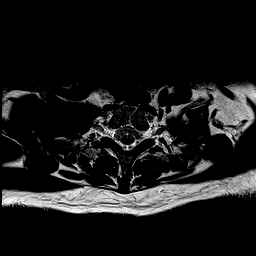
[im 5/25]
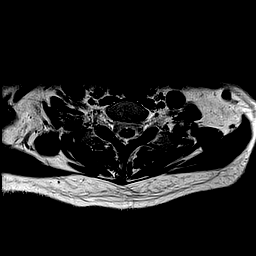
[im 7/25]
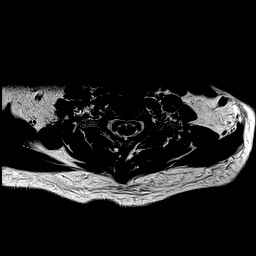
[im 11/25]
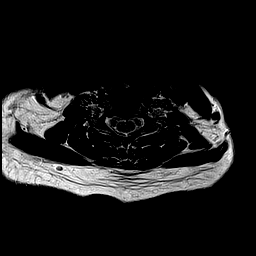
[im 14/25]
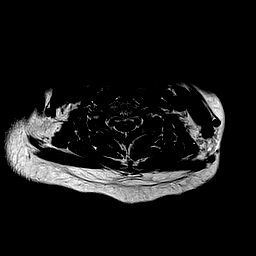
[im 18/25]
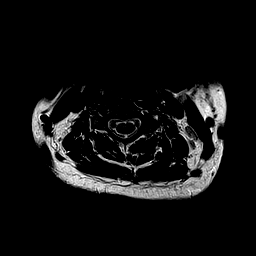
[im 20/25]
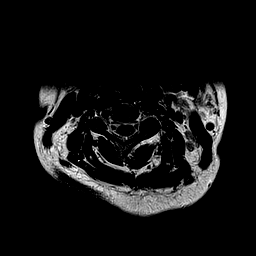
[im 22/25]
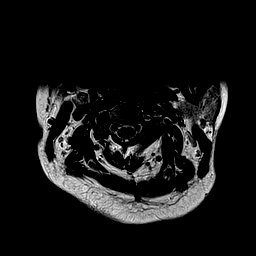
[im 25/25]
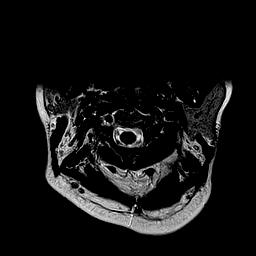

[Series 7: mpgr ax · axial · 3.0mm · 0.35mm/px · z∈[-52,-14]mm · 4 of 25 slices shown]
[im 1/25]
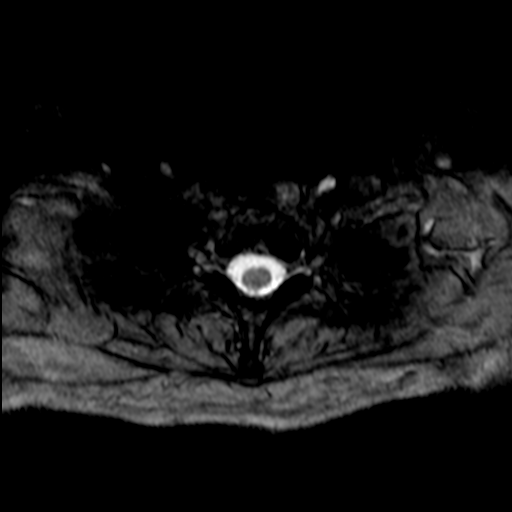
[im 5/25]
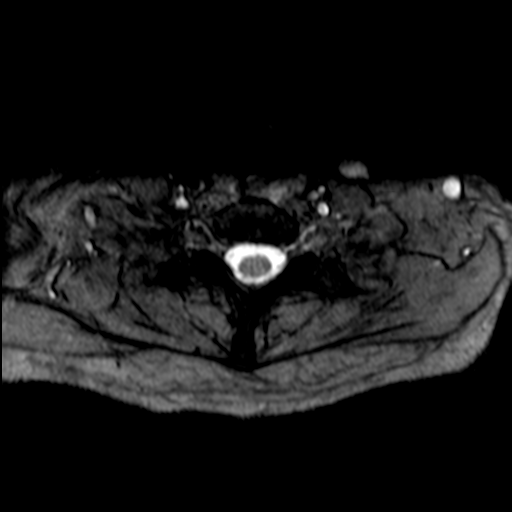
[im 7/25]
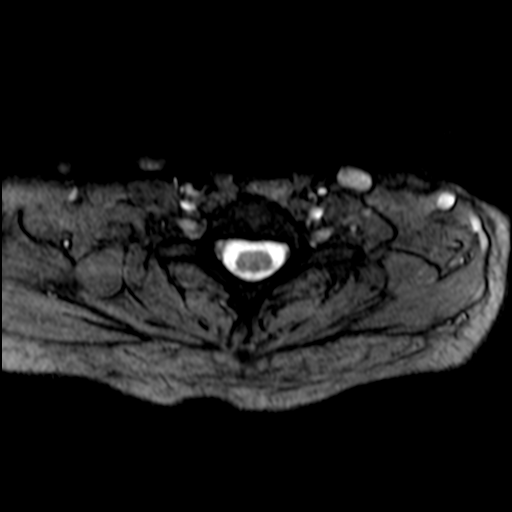
[im 11/25]
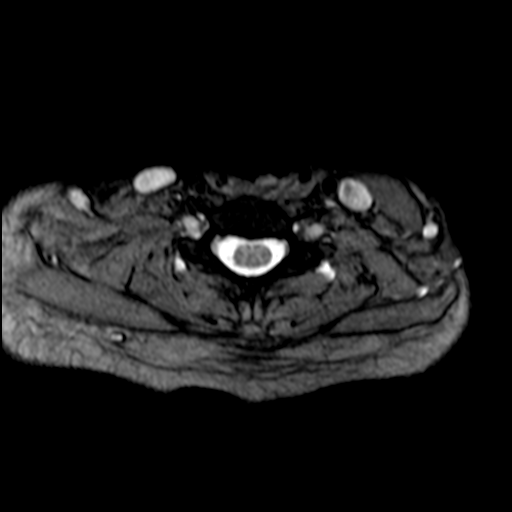

[37 of 48 positions shown; findings below may reference images not displayed]

FINDINGS: Alignment: Physiologic.

Vertebrae: No fracture, evidence of discitis, or bone lesion.

Cord: Normal signal and morphology.

Posterior Fossa, vertebral arteries, paraspinal tissues: On the far
right lateral slice of the sagittal STIR acquisition hyperintensity
at the C2-3 level is likely a venous or nodal structure. No
associated mass or inflammation seen on axial slices which cover a
broader area.

Disc levels:

No herniation, impingement, or facet arthropathy.
IMPRESSION: Negative exam.  No explanation for neck pain.

## 2019-09-02 ENCOUNTER — Encounter: Payer: Self-pay | Admitting: Adult Health
